# Patient Record
Sex: Female | Born: 1980 | Race: Black or African American | Hispanic: No | Marital: Single | Smoking: Current every day smoker
Health system: Southern US, Community
[De-identification: ages and names within clinical notes are randomized; demographics above are authoritative.]

## PROBLEM LIST (undated history)

## (undated) DIAGNOSIS — M419 Scoliosis, unspecified: Secondary | ICD-10-CM

## (undated) DIAGNOSIS — F32A Depression, unspecified: Secondary | ICD-10-CM

## (undated) DIAGNOSIS — F419 Anxiety disorder, unspecified: Secondary | ICD-10-CM

## (undated) DIAGNOSIS — J45909 Unspecified asthma, uncomplicated: Secondary | ICD-10-CM

## (undated) HISTORY — DX: Depression, unspecified: F32.A

## (undated) HISTORY — PX: DENTAL SURGERY: SHX609

## (undated) HISTORY — DX: Unspecified asthma, uncomplicated: J45.909

## (undated) HISTORY — DX: Anxiety disorder, unspecified: F41.9

---

## 2006-04-25 ENCOUNTER — Emergency Department: Payer: Self-pay | Admitting: Emergency Medicine

## 2009-05-05 ENCOUNTER — Emergency Department: Payer: Self-pay | Admitting: Emergency Medicine

## 2009-12-25 ENCOUNTER — Inpatient Hospital Stay: Payer: Self-pay

## 2010-03-19 ENCOUNTER — Emergency Department: Payer: Self-pay | Admitting: Emergency Medicine

## 2010-03-19 ENCOUNTER — Emergency Department: Payer: Self-pay | Admitting: Internal Medicine

## 2011-12-04 ENCOUNTER — Emergency Department: Payer: Self-pay | Admitting: Emergency Medicine

## 2011-12-04 LAB — PREGNANCY, URINE: Pregnancy Test, Urine: NEGATIVE m[IU]/mL

## 2014-01-14 IMAGING — CR DG CHEST 2V
1 series · 2 of 2 positions shown · non-contrast
Comparison: none

REASON FOR EXAM: chest wall pain
COMMENTS:

[Series 1: w chest pa · 0.14mm/px · 2 of 2 slices shown]
[im 1/2]
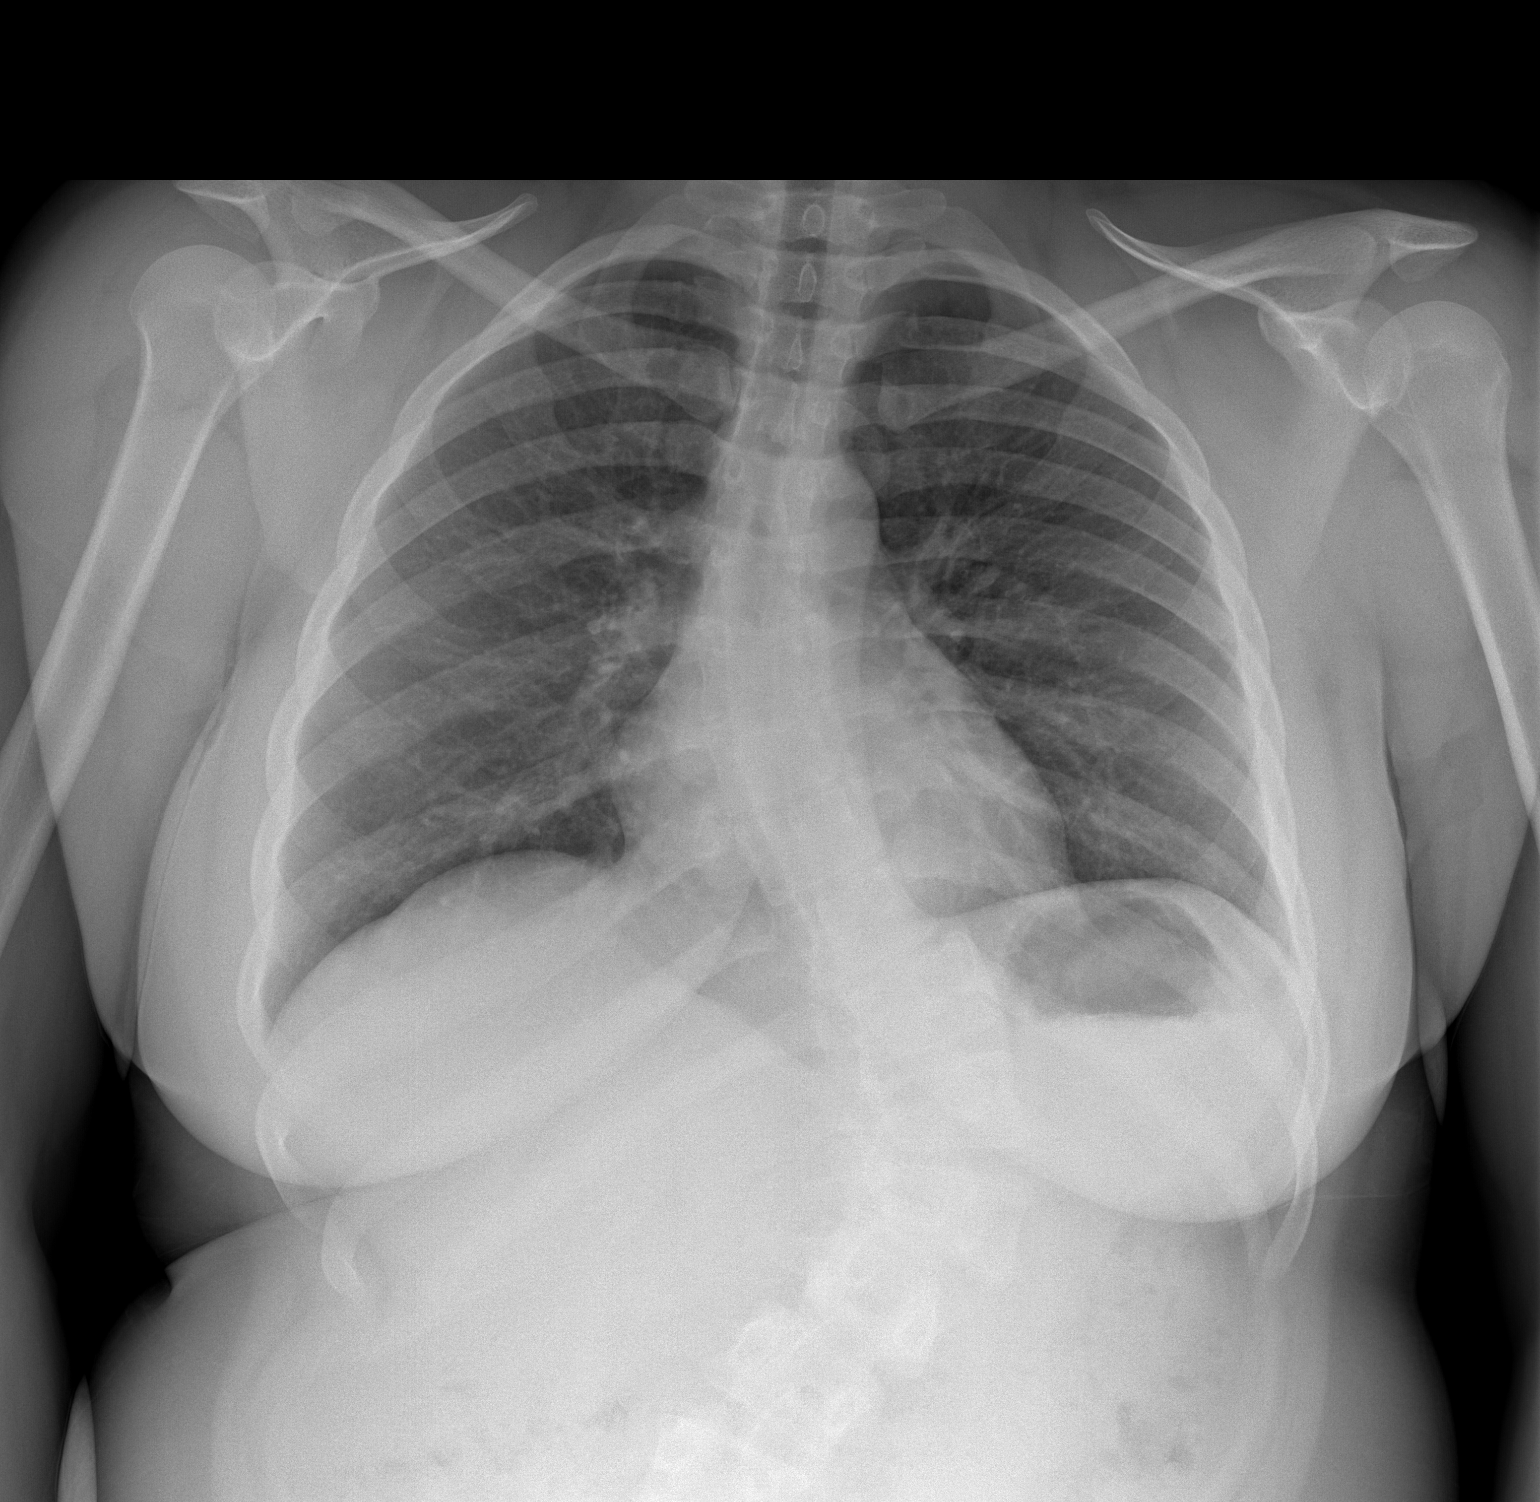
[im 2/2]
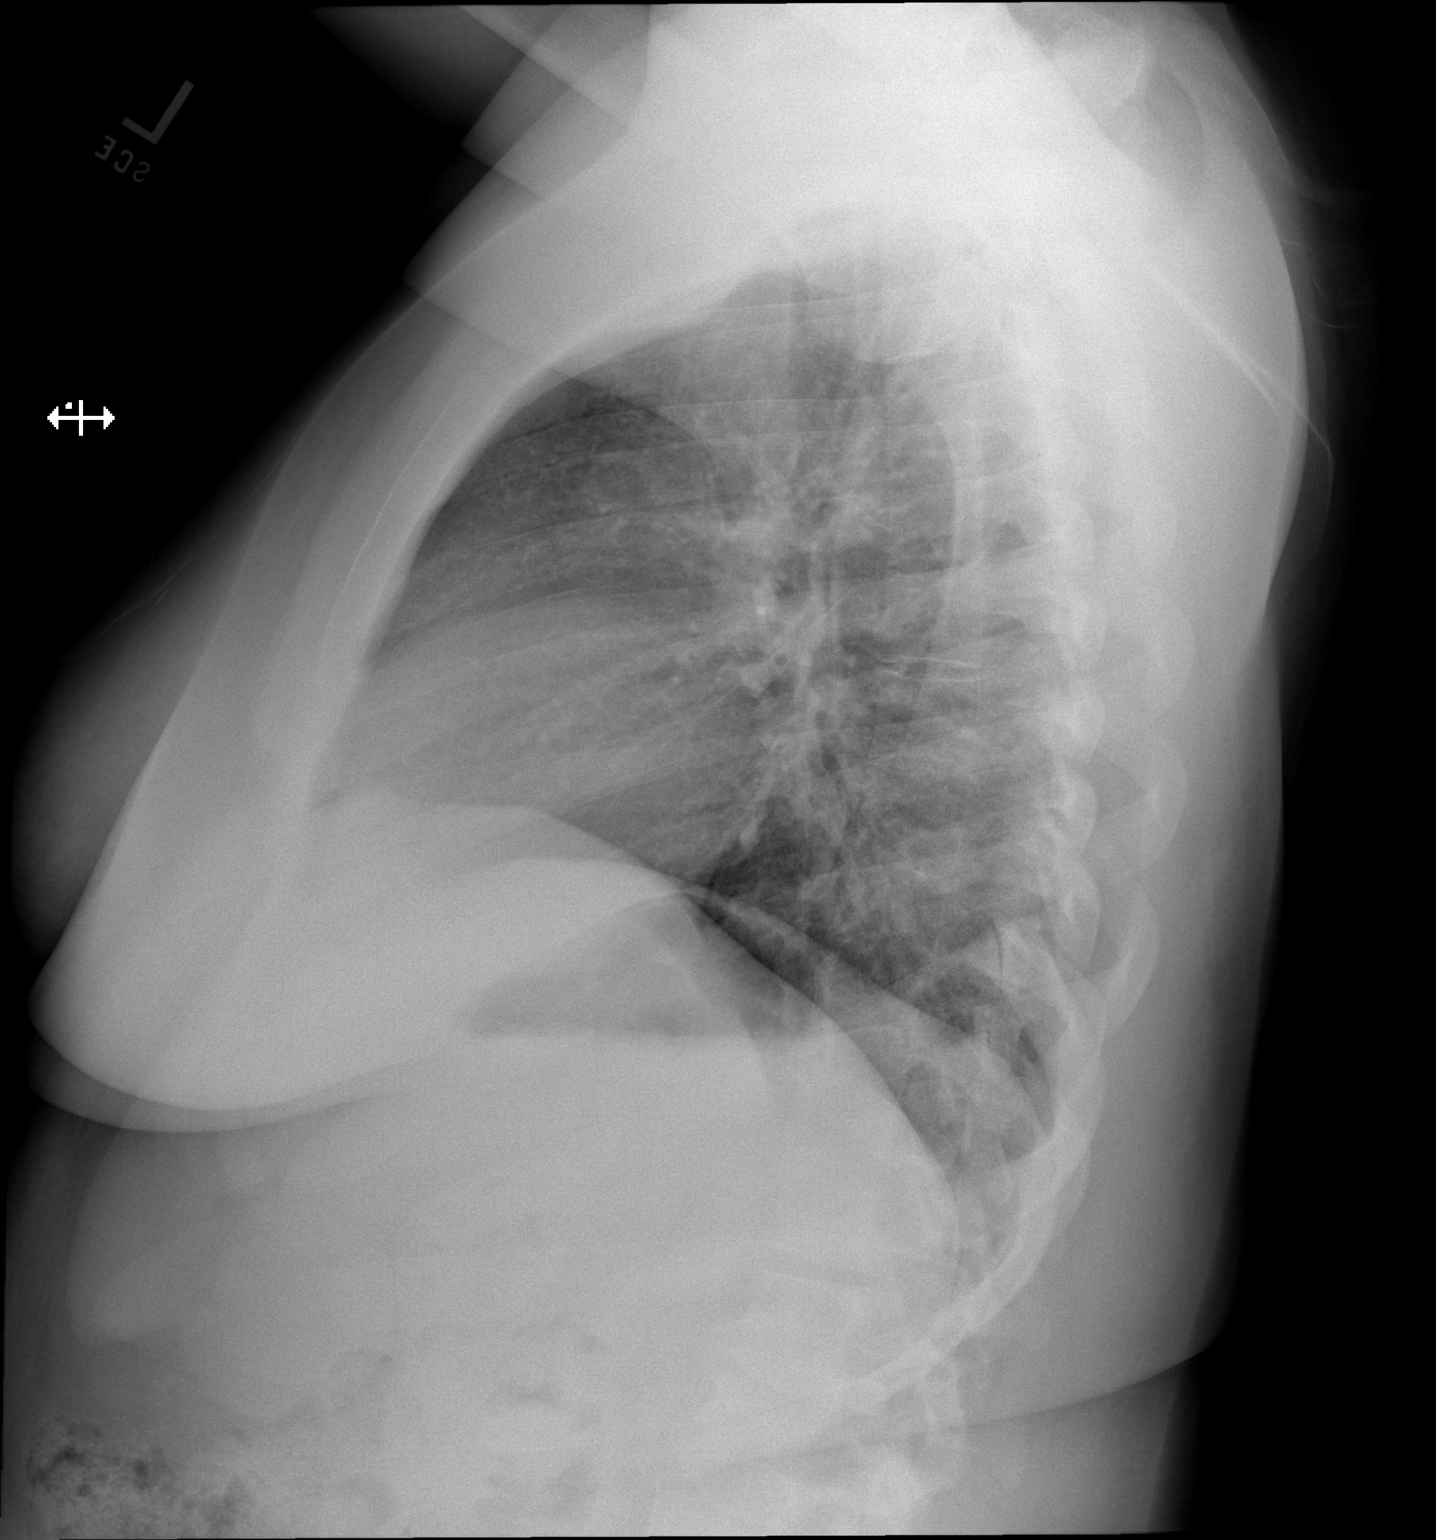

[2 of 2 positions shown; findings below may reference images not displayed]

PROCEDURE:     DXR - DXR CHEST PA (OR AP) AND LATERAL  - December 04, 2011  [DATE]

RESULT:     There is a lower thoracic scoliosis concave toward the left. The
lungs are clear. The cardiac silhouette is normal. The bony structures are
otherwise unremarkable. There is no edema, infiltrate, effusion or
pneumothorax.
IMPRESSION: 1. Thoracic scoliosis.
2. No acute cardiopulmonary disease evident.

[REDACTED]

## 2014-05-13 ENCOUNTER — Emergency Department: Payer: Self-pay | Admitting: Emergency Medicine

## 2016-10-11 ENCOUNTER — Emergency Department
Admission: EM | Admit: 2016-10-11 | Discharge: 2016-10-11 | Disposition: A | Discharge status: Home / Self Care | Payer: No Typology Code available for payment source | Attending: Emergency Medicine | Admitting: Emergency Medicine

## 2016-10-11 DIAGNOSIS — Y999 Unspecified external cause status: Secondary | ICD-10-CM | POA: Insufficient documentation

## 2016-10-11 DIAGNOSIS — S161XXA Strain of muscle, fascia and tendon at neck level, initial encounter: Secondary | ICD-10-CM | POA: Diagnosis not present

## 2016-10-11 DIAGNOSIS — Y92410 Unspecified street and highway as the place of occurrence of the external cause: Secondary | ICD-10-CM | POA: Diagnosis not present

## 2016-10-11 DIAGNOSIS — S3992XA Unspecified injury of lower back, initial encounter: Secondary | ICD-10-CM | POA: Diagnosis present

## 2016-10-11 DIAGNOSIS — S39012A Strain of muscle, fascia and tendon of lower back, initial encounter: Secondary | ICD-10-CM

## 2016-10-11 DIAGNOSIS — J189 Pneumonia, unspecified organism: Secondary | ICD-10-CM

## 2016-10-11 DIAGNOSIS — F17200 Nicotine dependence, unspecified, uncomplicated: Secondary | ICD-10-CM | POA: Insufficient documentation

## 2016-10-11 DIAGNOSIS — Y939 Activity, unspecified: Secondary | ICD-10-CM | POA: Insufficient documentation

## 2016-10-11 HISTORY — DX: Scoliosis, unspecified: M41.9

## 2016-10-11 MED ORDER — AZITHROMYCIN 250 MG PO TABS: ORAL_TABLET | ORAL | 0 refills | Status: DC

## 2016-10-11 MED ORDER — METHOCARBAMOL 500 MG PO TABS: 500.0000 mg | ORAL_TABLET | Freq: Four times a day (QID) | ORAL | 0 refills | Status: DC

## 2016-10-11 MED ORDER — PREDNISONE 50 MG PO TABS: 50.0000 mg | ORAL_TABLET | Freq: Every day | ORAL | 0 refills | Status: DC

## 2016-10-11 MED ORDER — KETOROLAC TROMETHAMINE 30 MG/ML IJ SOLN
30.0000 mg | Freq: Once | INTRAMUSCULAR | Status: AC
  Administered 2016-10-11: 30 mg via INTRAMUSCULAR
  Filled 2016-10-11: qty 1

## 2016-10-11 MED ORDER — ALBUTEROL SULFATE HFA 108 (90 BASE) MCG/ACT IN AERS: 2.0000 | INHALATION_SPRAY | RESPIRATORY_TRACT | 0 refills | Status: DC | PRN

## 2016-10-11 MED ORDER — MELOXICAM 15 MG PO TABS: 15.0000 mg | ORAL_TABLET | Freq: Every day | ORAL | 0 refills | Status: DC
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
## 2016-10-11 NOTE — ED Notes (Signed)
No answer when called several times from lobby 
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
## 2020-04-19 ENCOUNTER — Emergency Department
Admission: EM | Admit: 2020-04-19 | Discharge: 2020-04-19 | Disposition: A | Discharge status: Home / Self Care | Payer: Medicaid Other | Attending: Emergency Medicine | Admitting: Emergency Medicine

## 2020-04-19 ENCOUNTER — Other Ambulatory Visit: Payer: Self-pay

## 2020-04-19 ENCOUNTER — Emergency Department: Payer: Medicaid Other

## 2020-04-19 DIAGNOSIS — S3992XA Unspecified injury of lower back, initial encounter: Secondary | ICD-10-CM | POA: Diagnosis present

## 2020-04-19 DIAGNOSIS — F17200 Nicotine dependence, unspecified, uncomplicated: Secondary | ICD-10-CM | POA: Diagnosis not present

## 2020-04-19 DIAGNOSIS — W19XXXA Unspecified fall, initial encounter: Secondary | ICD-10-CM | POA: Insufficient documentation

## 2020-04-19 DIAGNOSIS — S39012A Strain of muscle, fascia and tendon of lower back, initial encounter: Secondary | ICD-10-CM | POA: Insufficient documentation

## 2020-04-19 DIAGNOSIS — Z79899 Other long term (current) drug therapy: Secondary | ICD-10-CM | POA: Diagnosis not present

## 2020-04-19 LAB — POCT PREGNANCY, URINE: Preg Test, Ur: NEGATIVE

## 2020-04-19 MED ORDER — OXYCODONE-ACETAMINOPHEN 5-325 MG PO TABS
1.0000 | ORAL_TABLET | Freq: Once | ORAL | Status: AC
  Administered 2020-04-19: 17:00:00 1 via ORAL
  Filled 2020-04-19: qty 1

## 2020-04-19 MED ORDER — ORPHENADRINE CITRATE 30 MG/ML IJ SOLN
60.0000 mg | Freq: Once | INTRAMUSCULAR | Status: AC
  Administered 2020-04-19: 19:00:00 60 mg via INTRAMUSCULAR
  Filled 2020-04-19: qty 2

## 2020-04-19 MED ORDER — KETOROLAC TROMETHAMINE 30 MG/ML IJ SOLN
30.0000 mg | Freq: Once | INTRAMUSCULAR | Status: AC
  Administered 2020-04-19: 19:00:00 30 mg via INTRAMUSCULAR
  Filled 2020-04-19: qty 1

## 2020-04-19 MED ORDER — METHOCARBAMOL 500 MG PO TABS: 500.0000 mg | ORAL_TABLET | Freq: Four times a day (QID) | ORAL | 0 refills | Status: DC

## 2020-04-19 MED ORDER — MELOXICAM 15 MG PO TABS: 15.0000 mg | ORAL_TABLET | Freq: Every day | ORAL | 0 refills | Status: DC

## 2020-04-19 NOTE — ED Notes (Addendum)
See triage note. Pt with difficulty walking/bending over after playing with grandchildren. Pt stating pain predominately in right lower back

## 2020-04-19 NOTE — ED Triage Notes (Signed)
Pt states "I was playing with my grandbaby " and since having lower back pain.

## 2020-04-19 NOTE — ED Provider Notes (Signed)
[facility] Emergency Department Provider Note  ____________________________________________  Time seen: Approximately 5:06 PM  I have reviewed the triage vital signs and the nursing notes.   HISTORY  Chief Complaint Back Pain    HPI Kristen Beasley is a 39 y.o. female who presents the emergency department complaining of lower back pain.  Patient states that she was playing with her kids, fell backwards landing on her back.  Patient has a history of scoliosis and has chronic back issues.  Since this injury she has had significantly increased pain.  Some radicular symptoms but no paresthesias.  No bowel or bladder dysfunction, saddle anesthesia.  Over-the-counter medications or not alleviating symptoms.  She did not hit her head or lose consciousness.  Home complaint at this time is lower back pain.  No urinary or GI complaints.         Past Medical History:  Diagnosis Date  . Scoliosis     There are no problems to display for this patient.   History reviewed. No pertinent surgical history.  Prior to Admission medications   Medication Sig Start Date End Date Taking? Authorizing Provider  albuterol (PROVENTIL HFA;VENTOLIN HFA) 108 (90 Base) MCG/ACT inhaler Inhale 2 puffs into the lungs every 4 (four) hours as needed for wheezing or shortness of breath. 10/11/16   Cierra Rothgeb, Charline Bills, PA-C  azithromycin (ZITHROMAX Z-PAK) 250 MG tablet Take 2 tablets (500 mg) on  Beas[place] 1,  followed by 1 tablet (250 mg) once daily on Days 2 through 5. 10/11/16   Barrie Sigmund, Charline Bills, PA-C  meloxicam (MOBIC) 15 MG tablet Take 1 tablet (15 mg total) by mouth daily. 04/19/20   Hadassa Cermak, Charline Bills, PA-C  methocarbamol (ROBAXIN) 500 MG tablet Take 1 tablet (500 mg total) by mouth 4 (four) times daily. 04/19/20   Murna Backer, Charline Bills, PA-C  predniSONE (DELTASONE) 50 MG tablet Take 1 tablet (50 mg total) by mouth daily with breakfast. 10/11/16   Tracye Szuch, Charline Bills, PA-C     Allergies Patient has no known allergies.  No family history on file.  Social History Social History   Tobacco Use  . Smoking status: Current Every [place] Smoker  . Smokeless tobacco: Never Used  Substance Use Topics  . Alcohol use: No  . Drug use: No     Review of Systems  Constitutional: No fever/chills Eyes: No visual changes. No discharge ENT: No upper respiratory complaints. Cardiovascular: no chest pain. Respiratory: no cough. No SOB. Gastrointestinal: No abdominal pain.  No nausea, no vomiting.  No diarrhea.  No constipation. Genitourinary: Negative for dysuria. No hematuria Musculoskeletal: Positive for low back pain after a fall Skin: Negative for rash, abrasions, lacerations, ecchymosis. Neurological: Negative for headaches, focal weakness or numbness. 10-point ROS otherwise negative.  ____________________________________________   PHYSICAL EXAM:  VITAL SIGNS: ED Triage Vitals  Enc Vitals Group     BP 04/19/20 1558 (!) 134/92     Pulse Rate 04/19/20 1558 85     Resp 04/19/20 1558 18     Temp 04/19/20 1558 98.7 F (37.1 C)     Temp Source 04/19/20 1558 Oral     SpO2 04/19/20 1558 99 %     Weight 04/19/20 1555 150 lb (68 kg)     Height 04/19/20 1555 5' (1.524 m)     Head Circumference --      Peak Flow --      Pain Score 04/19/20 1555 10     Pain Loc --  Pain Edu? --      Excl. in [place]? --      Constitutional: Alert and oriented. Well appearing and in no acute distress. Eyes: Conjunctivae are normal. PERRL. EOMI. Head: Atraumatic. ENT:      Ears:       Nose: No congestion/rhinnorhea.      Mouth/Throat: Mucous membranes are moist.  Neck: No stridor.    Cardiovascular: Normal rate, regular rhythm. Normal S1 and S2.  Good peripheral circulation. Respiratory: Normal respiratory effort without tachypnea or retractions. Lungs CTAB. Good air entry to the bases with no decreased or absent breath sounds. Gastrointestinal: Bowel sounds 4  quadrants. Soft and nontender to palpation. No guarding or rigidity. No palpable masses. No distention. No CVA tenderness. Musculoskeletal: Full range of motion to all extremities. No gross deformities appreciated.  Visualization of the lumbar spine reveals no visible signs of trauma with abrasions, lacerations, ecchymosis.  Diffuse tenderness throughout the lumbar spine including bilateral paraspinal muscles.  No point specific tenderness.  No palpable abnormality or step-off.  No extension into the SI joints or sciatic notches.  Dorsalis pedis pulses sensation intact bilateral lower extremities. Neurologic:  Normal speech and language. No gross focal neurologic deficits are appreciated.  Skin:  Skin is warm, dry and intact. No rash noted. Psychiatric: Mood and affect are normal. Speech and behavior are normal. Patient exhibits appropriate insight and judgement.   ____________________________________________   LABS (all labs ordered are listed, but only abnormal results are displayed)  Labs Reviewed  POC URINE PREG, ED  POCT PREGNANCY, URINE   ____________________________________________  EKG   ____________________________________________  RADIOLOGY I personally viewed and evaluated these images as part of my medical decision making, as well as reviewing the written report by the radiologist.  DG Lumbar Spine 2-3 Views  Result Date: 04/19/2020 CLINICAL DATA:  Recent fall with low back pain and history of scoliosis EXAM: LUMBAR SPINE - 3 VIEW COMPARISON:  03/19/2010 FINDINGS: Five lumbar type vertebral bodies are well visualized. Posterior fusion defect at S1 is seen. No vertebral body compression deformities are noted. Scoliosis is seen concave to the right centered at the thoracolumbar junction. No anterolisthesis is seen. IMPRESSION: Changes consistent with scoliosis similar to that seen on prior CT examinations. No acute abnormality noted. Electronically Signed   By: Inez Catalina M.D.    On: 04/19/2020 18:27    ____________________________________________    PROCEDURES  Procedure(s) performed:    Procedures    Medications  ketorolac (TORADOL) 30 MG/ML injection 30 mg (has no administration in time range)  orphenadrine (NORFLEX) injection 60 mg (has no administration in time range)  oxyCODONE-acetaminophen (PERCOCET/ROXICET) 5-325 MG per tablet 1 tablet (1 tablet Oral Given 04/19/20 1727)     ____________________________________________   INITIAL IMPRESSION / ASSESSMENT AND PLAN / ED COURSE  Pertinent labs & imaging results that were available during my care of the patient were reviewed by me and considered in my medical decision making (see chart for details).  Review of the [place] CSRS was performed in accordance of the [place] prior to dispensing any controlled drugs.           Patient's diagnosis is consistent with lumbar strain.  Patient presented to the emergency department complaining of low back pain.  Patient had been playing with her kids, fell backwards landing on her back.  History of scoliosis.  On exam palpable spasms were appreciated.  Imaging revealed no acute changes from previous imaging.  I have a low concern for  ruptured disc or fracture.  Patient will be treated with anti-inflammatory muscle relaxer at home.  Follow-up primary care as needed. Patient is given ED precautions to return to the ED for any worsening or new symptoms.     ____________________________________________  FINAL CLINICAL IMPRESSION(S) / ED DIAGNOSES  Final diagnoses:  Strain of lumbar region, initial encounter      NEW MEDICATIONS STARTED DURING THIS VISIT:  ED Discharge Orders         Ordered    meloxicam (MOBIC) 15 MG tablet  Daily        04/19/20 1835    methocarbamol (ROBAXIN) 500 MG tablet  4 times daily        04/19/20 1835              This chart was dictated using voice recognition software/Dragon. Despite best efforts to proofread, errors  can occur which can change the meaning. Any change was purely unintentional.    Darletta Moll, PA-C 04/19/20 1837    Nance Pear, MD 04/19/20 [phone]

## 2021-09-26 ENCOUNTER — Ambulatory Visit: Payer: Medicaid Other

## 2021-11-23 ENCOUNTER — Ambulatory Visit: Payer: Medicaid Other

## 2022-05-07 ENCOUNTER — Ambulatory Visit (LOCAL_COMMUNITY_HEALTH_CENTER): Payer: Medicaid Other | Admitting: Nurse Practitioner

## 2022-05-07 ENCOUNTER — Encounter: Payer: Self-pay | Admitting: Nurse Practitioner

## 2022-05-07 VITALS — BP 123/77 | HR 79 | Ht 59.0 in | Wt 137.8 lb

## 2022-05-07 DIAGNOSIS — F419 Anxiety disorder, unspecified: Secondary | ICD-10-CM

## 2022-05-07 DIAGNOSIS — Z3009 Encounter for other general counseling and advice on contraception: Secondary | ICD-10-CM | POA: Diagnosis not present

## 2022-05-07 DIAGNOSIS — Z01419 Encounter for gynecological examination (general) (routine) without abnormal findings: Secondary | ICD-10-CM

## 2022-05-07 DIAGNOSIS — A599 Trichomoniasis, unspecified: Secondary | ICD-10-CM

## 2022-05-07 DIAGNOSIS — Z113 Encounter for screening for infections with a predominantly sexual mode of transmission: Secondary | ICD-10-CM

## 2022-05-07 DIAGNOSIS — Z3201 Encounter for pregnancy test, result positive: Secondary | ICD-10-CM | POA: Diagnosis not present

## 2022-05-07 LAB — WET PREP FOR TRICH, YEAST, CLUE
Trichomonas Exam: POSITIVE — AB
Yeast Exam: NEGATIVE

## 2022-05-07 LAB — PREGNANCY, URINE: Preg Test, Ur: POSITIVE — AB

## 2022-05-07 LAB — HEPATITIS B SURFACE ANTIGEN: Hepatitis B Surface Ag: NONREACTIVE

## 2022-05-07 LAB — HM HIV SCREENING LAB: HM HIV Screening: NEGATIVE

## 2022-05-07 LAB — HM HEPATITIS C SCREENING LAB: HM Hepatitis Screen: NEGATIVE

## 2022-05-07 MED ORDER — METRONIDAZOLE 500 MG PO TABS: 500.0000 mg | ORAL_TABLET | Freq: Two times a day (BID) | ORAL | 0 refills | Status: DC

## 2022-05-07 MED ORDER — SM PRENATAL VITAMINS 28-0.8 MG PO TABS: 1.0000 | ORAL_TABLET | Freq: Every day | ORAL | 0 refills | Status: DC
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
## 2022-05-07 NOTE — Progress Notes (Signed)
Pt visit for PE, Pap, and STI testing. Family planning packet provided and contents reviewed. Birth control counseling provided. Smoking cessation counseling provided. RN provided education on mental health regarding pt's reported anxiety. Pt  also reported recent partner change with unprotected sex. Seen by FNP White. Pregnancy test positive, and Trich positive. Initial lab results reviewed with pt and Positive Pregnancy test resources given by FNP White. Pt scheduled Prenatal prior to departing.

## 2022-05-08 LAB — HEMOGLOBIN A1C
Est. average glucose Bld gHb Est-mCnc: 117 mg/dL
Hgb A1c MFr Bld: 5.7 % — ABNORMAL HIGH (ref 4.8–5.6)

## 2022-05-12 LAB — GONOCOCCUS CULTURE

## 2022-05-13 LAB — IGP, APTIMA HPV
HPV Aptima: NEGATIVE
PAP Smear Comment: 0

## 2022-05-17 NOTE — Addendum Note (Signed)
Addended by: Cletis Media on: 05/17/2022 12:19 PM   Modules accepted: Orders

## 2022-05-31 ENCOUNTER — Telehealth: Payer: Self-pay | Admitting: Family Medicine

## 2022-05-31 IMAGING — CR DG LUMBAR SPINE 2-3V
3 series · 3 of 3 positions shown · non-contrast
Comparison: 03/19/2010

CLINICAL DATA: Recent fall with low back pain and history of
scoliosis

EXAM:
LUMBAR SPINE - 3 VIEW

[l-spine ap]
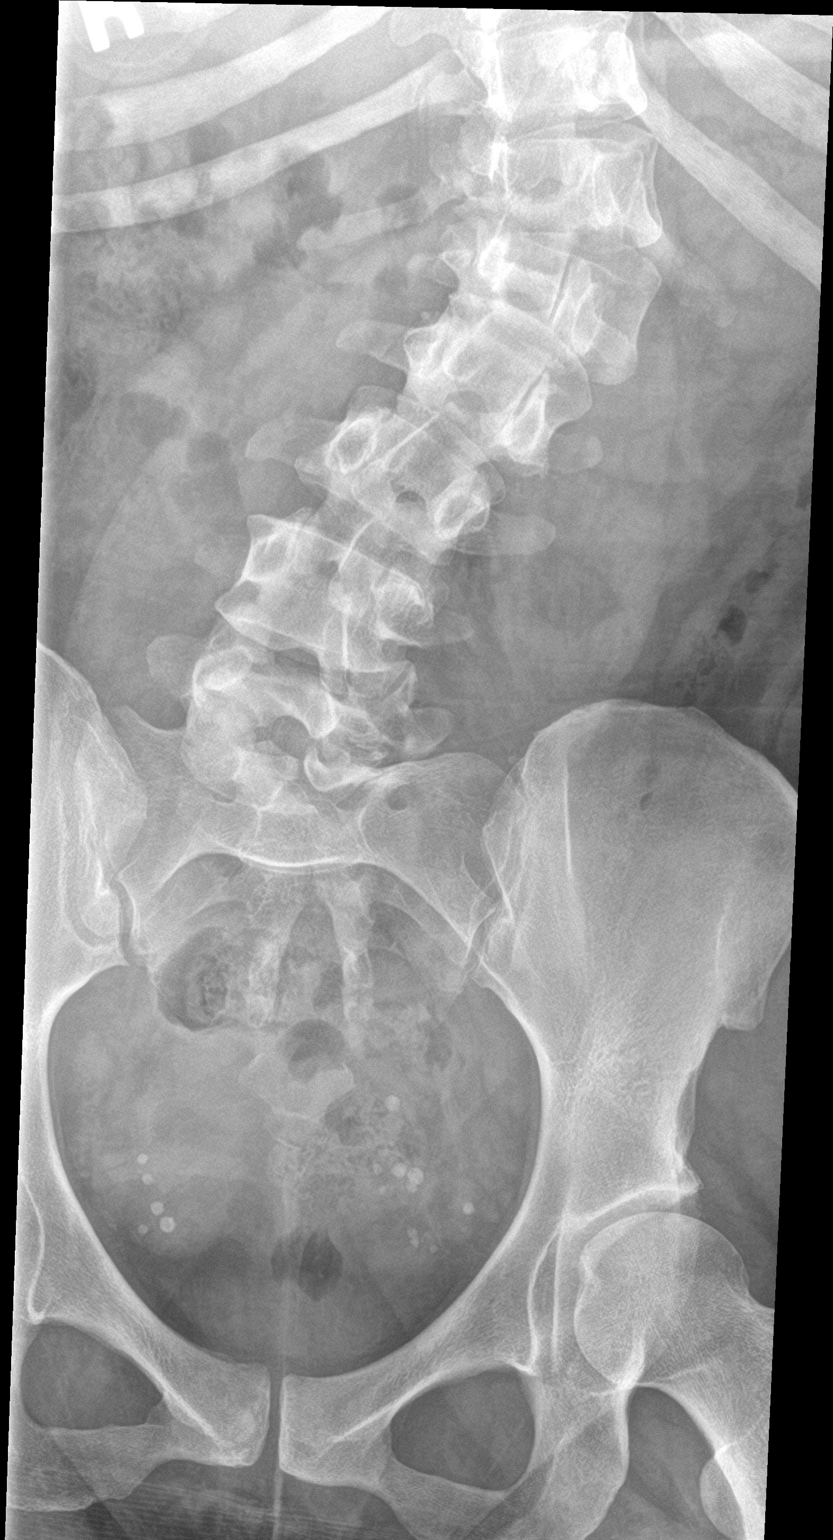

[l-spine spot]
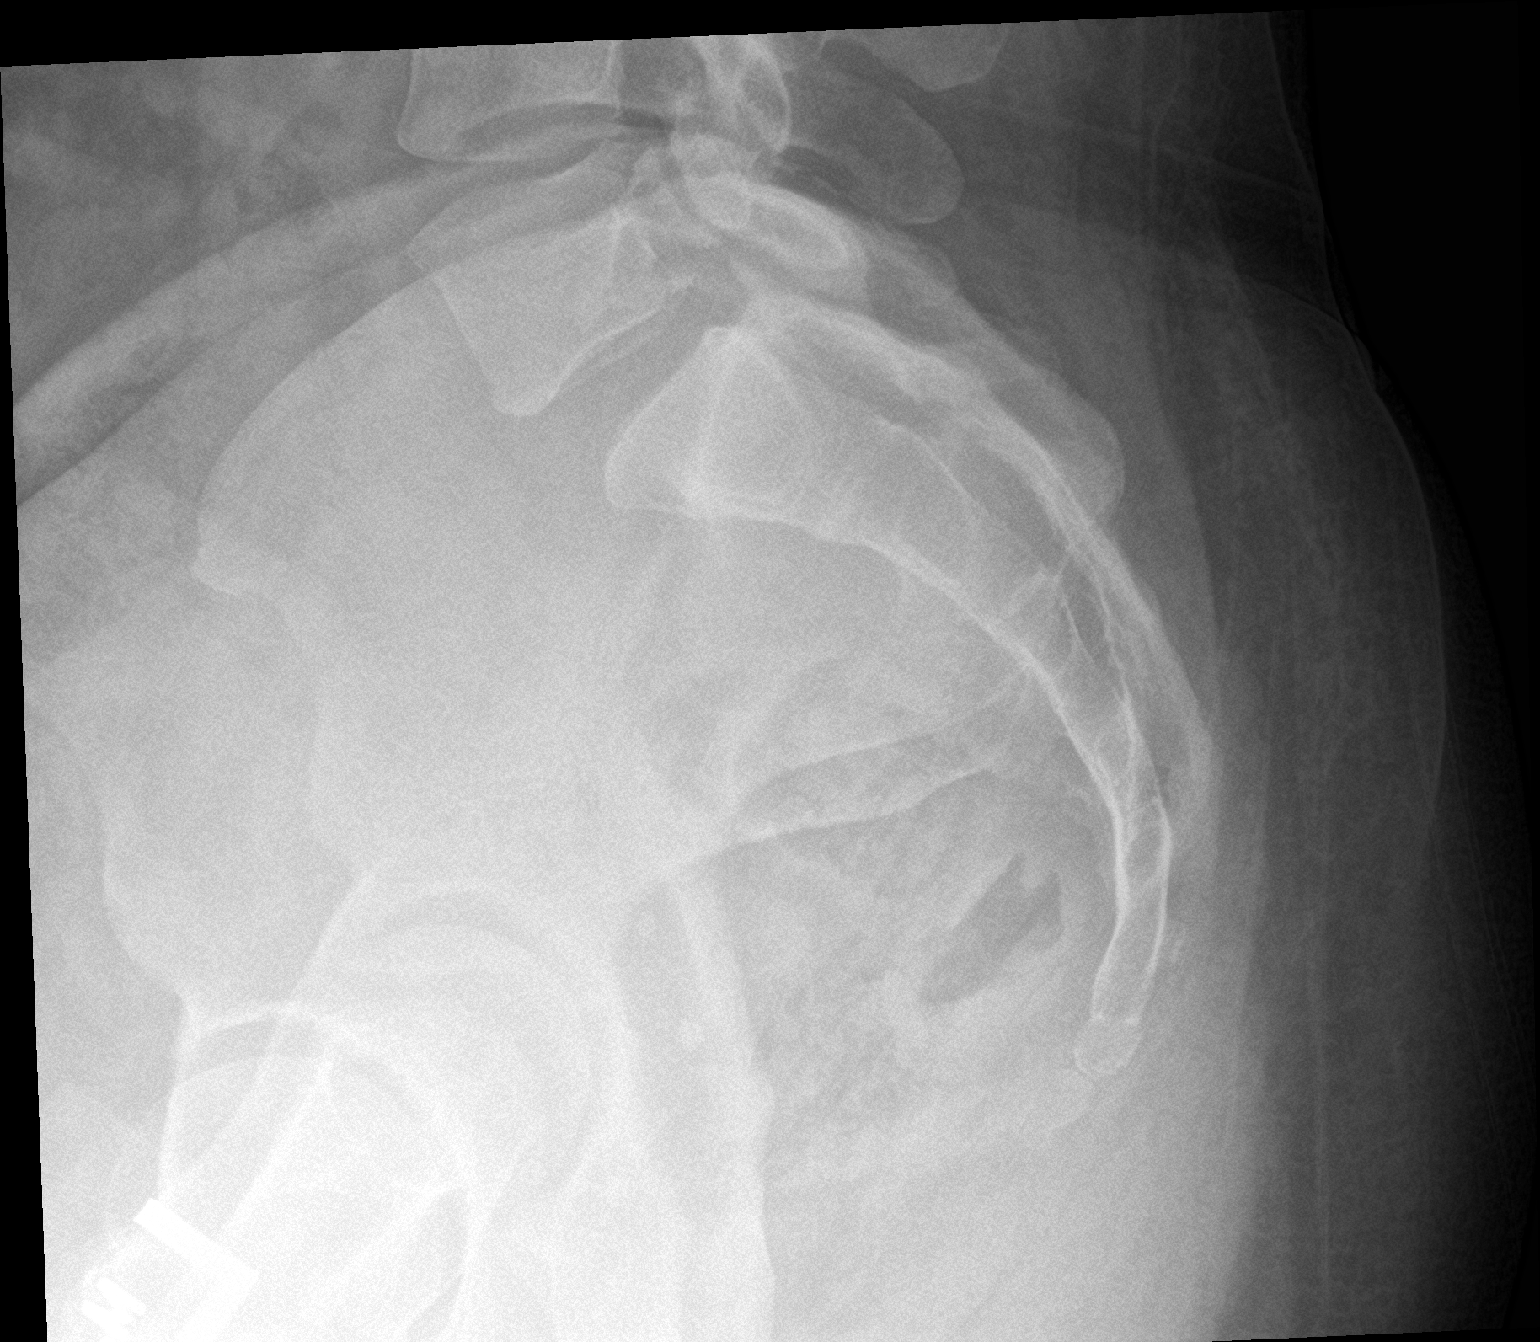

[l-spine lat]
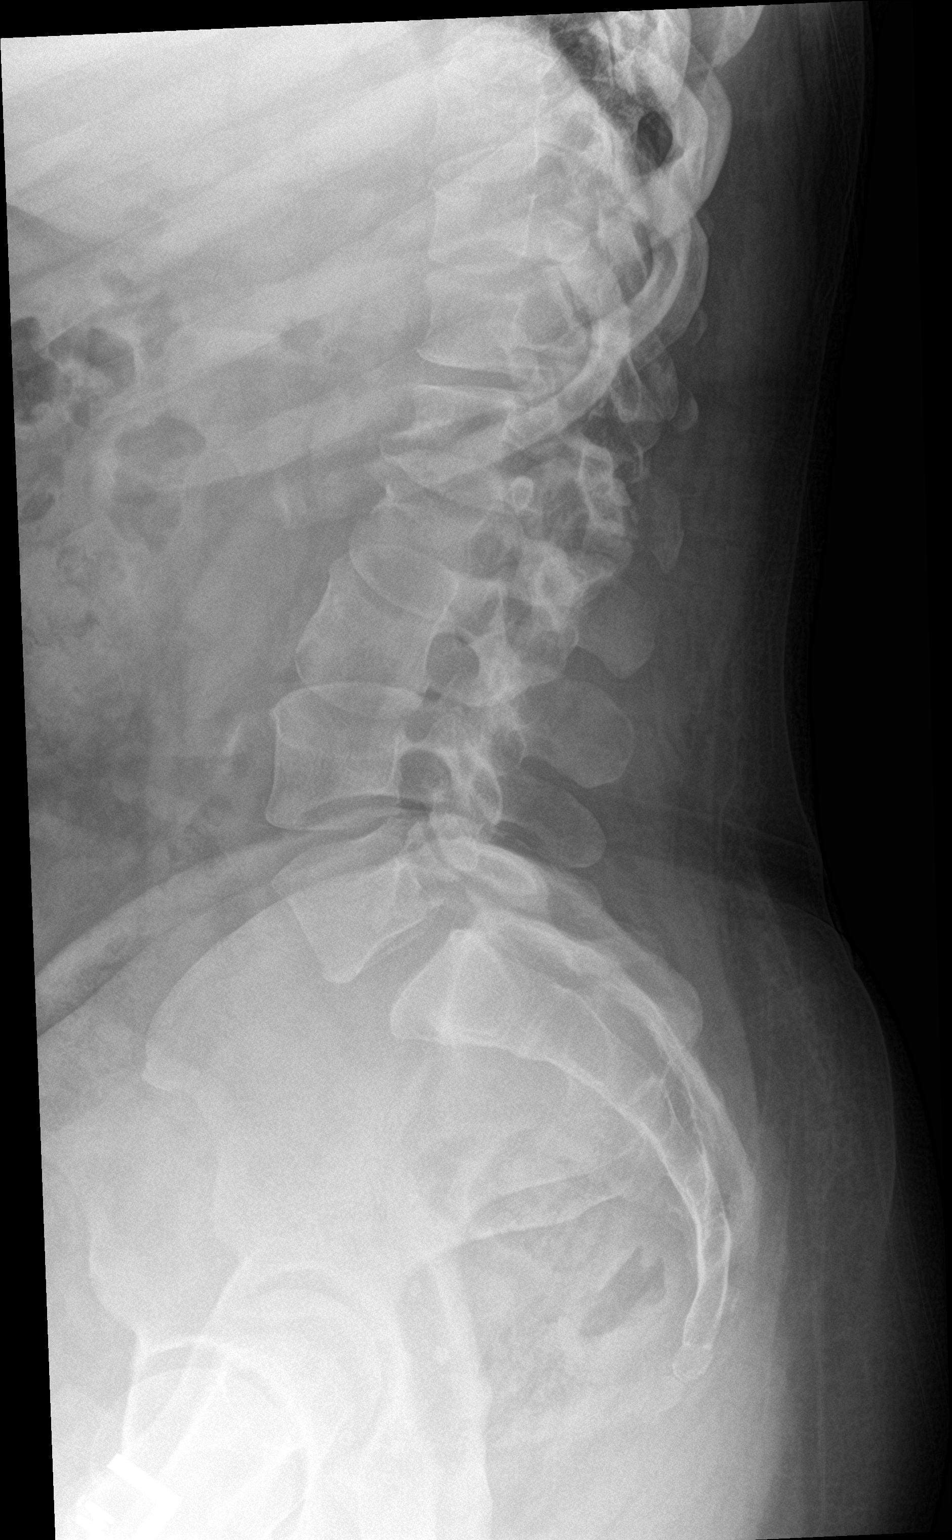

[3 of 3 positions shown; findings below may reference images not displayed]

FINDINGS: Five lumbar type vertebral bodies are well visualized. Posterior
fusion defect at S1 is seen. No vertebral body compression
deformities are noted. Scoliosis is seen concave to the right
centered at the thoracolumbar junction. No anterolisthesis is seen.
IMPRESSION: Changes consistent with scoliosis similar to that seen on prior CT
examinations.

No acute abnormality noted.

## 2022-05-31 NOTE — Telephone Encounter (Signed)
I had a few labs  done on my last visit there I would like the results.

## 2022-06-06 ENCOUNTER — Encounter: Payer: Self-pay | Admitting: Emergency Medicine

## 2022-06-06 DIAGNOSIS — O009 Unspecified ectopic pregnancy without intrauterine pregnancy: Secondary | ICD-10-CM | POA: Diagnosis present

## 2022-06-06 DIAGNOSIS — N939 Abnormal uterine and vaginal bleeding, unspecified: Secondary | ICD-10-CM | POA: Insufficient documentation

## 2022-06-06 DIAGNOSIS — D72829 Elevated white blood cell count, unspecified: Secondary | ICD-10-CM | POA: Insufficient documentation

## 2022-06-06 LAB — COMPREHENSIVE METABOLIC PANEL
ALT: 17 U/L (ref 0–44)
AST: 23 U/L (ref 15–41)
Albumin: 3.7 g/dL (ref 3.5–5.0)
Alkaline Phosphatase: 47 U/L (ref 38–126)
Anion gap: 9 (ref 5–15)
BUN: 11 mg/dL (ref 6–20)
CO2: 23 mmol/L (ref 22–32)
Calcium: 9.5 mg/dL (ref 8.9–10.3)
Chloride: 101 mmol/L (ref 98–111)
Creatinine, Ser: 0.59 mg/dL (ref 0.44–1.00)
GFR, Estimated: >60 mL/min (ref >60)
Glucose, Bld: 139 mg/dL — ABNORMAL HIGH (ref 70–99)
Potassium: 3.5 mmol/L (ref 3.5–5.1)
Sodium: 133 mmol/L — ABNORMAL LOW (ref 135–145)
Total Bilirubin: 0.4 mg/dL (ref 0.3–1.2)
Total Protein: 7.6 g/dL (ref 6.5–8.1)

## 2022-06-06 LAB — LIPASE, BLOOD: Lipase: 32 U/L (ref 11–51)

## 2022-06-06 LAB — CBC
HCT: 35.4 % — ABNORMAL LOW (ref 36.0–46.0)
Hemoglobin: 12.1 g/dL (ref 12.0–15.0)
MCH: 32.7 pg (ref 26.0–34.0)
MCHC: 34.2 g/dL (ref 30.0–36.0)
MCV: 95.7 fL (ref 80.0–100.0)
Platelets: 360 10*3/uL (ref 150–400)
RBC: 3.7 MIL/uL — ABNORMAL LOW (ref 3.87–5.11)
RDW: 13.2 % (ref 11.5–15.5)
WBC: 20 10*3/uL — ABNORMAL HIGH (ref 4.0–10.5)
nRBC: 0 % (ref 0.0–0.2)

## 2022-06-06 LAB — HCG, QUANTITATIVE, PREGNANCY: hCG, Beta Chain, Quant, S: 80364 m[IU]/mL — ABNORMAL HIGH (ref <5)

## 2022-06-06 NOTE — ED Triage Notes (Signed)
Pt reports she is approx [redacted] weeks pregnant and began to have cramping after consuming what she thought was bad food on 11/21 and today she took 2 Midol at 1800. Pt reports vaginal bleeding 1 hour prior to arrival to ED.   LMP- 03/31/2022

## 2022-06-07 ENCOUNTER — Emergency Department
Admission: EM | Admit: 2022-06-07 | Discharge: 2022-06-07 | Disposition: A | Discharge status: Home / Self Care | Payer: Medicaid Other | Attending: Emergency Medicine | Admitting: Emergency Medicine

## 2022-06-07 ENCOUNTER — Emergency Department: Payer: Medicaid Other

## 2022-06-07 DIAGNOSIS — O4690 Antepartum hemorrhage, unspecified, unspecified trimester: Secondary | ICD-10-CM

## 2022-06-07 DIAGNOSIS — N390 Urinary tract infection, site not specified: Secondary | ICD-10-CM

## 2022-06-07 LAB — URINALYSIS, ROUTINE W REFLEX MICROSCOPIC
Bilirubin Urine: NEGATIVE
Glucose, UA: NEGATIVE mg/dL
Ketones, ur: 5 mg/dL — AB
Nitrite: POSITIVE — AB
Protein, ur: 30 mg/dL — AB
Specific Gravity, Urine: 1.027 (ref 1.005–1.030)
Squamous Epithelial / HPF: NONE SEEN (ref 0–5)
pH: 5 (ref 5.0–8.0)

## 2022-06-07 LAB — ABO/RH: ABO/RH(D): O POS

## 2022-06-07 MED ORDER — ACETAMINOPHEN 325 MG PO TABS: ORAL_TABLET | ORAL | 2 refills | Status: AC

## 2022-06-07 MED ORDER — ACETAMINOPHEN 325 MG PO TABS
650.0000 mg | ORAL_TABLET | Freq: Once | ORAL | Status: AC
  Administered 2022-06-07: 650 mg via ORAL
  Filled 2022-06-07: qty 2

## 2022-06-07 MED ORDER — CEPHALEXIN 500 MG PO CAPS: 500.0000 mg | ORAL_CAPSULE | Freq: Three times a day (TID) | ORAL | 0 refills | Status: AC

## 2022-06-07 MED ORDER — SODIUM CHLORIDE 0.9 % IV SOLN
1.0000 g | Freq: Once | INTRAVENOUS | Status: AC
  Administered 2022-06-07: 1 g via INTRAVENOUS
  Filled 2022-06-07: qty 10

## 2022-06-07 NOTE — ED Provider Notes (Signed)
[facility] Provider Note    Event Date/Time   First MD Initiated Contact with Patient 06/07/22 0103     (approximate)   History   Vaginal Bleeding   HPI  BODHI STENGLEIN is a 41 y.o. female who is para 4 gravida 2012.  She had an ectopic pregnancy.  She is having some bleeding from her vagina today.  She thought she had some cramping from bad food on the 21st and took 2 Midol today bleeding started after that.      Physical Exam   Triage Vital Signs: ED Triage Vitals [06/06/22 2254]  Enc Vitals Group     BP (!) 157/119     Pulse Rate 97     Resp 17     Temp 98.2 F (36.8 C)     Temp Source Oral     SpO2 100 %     Weight      Height      Head Circumference      Peak Flow      Pain Score      Pain Loc      Pain Edu?      Excl. in [place]?     Most recent vital signs: Vitals:   06/06/22 2254 06/07/22 0248  BP: (!) 157/119 (!) 106/93  Pulse: 97 89  Resp: 17   Temp: 98.2 F (36.8 C)   SpO2: 100% 100%    General: Awake, no distress.  CV:  Good peripheral perfusion.  Heart regular rate and rhythm no audible murmurs Resp:  Normal effort.  Lungs are clear Abd:  No distention.  Soft there is a little bit of lower abdominal tenderness GYN: There is a blood clot in the vagina there is no active bleeding there is no cervical motion tenderness although there is some mild tenderness throughout the whole vagina during exam.  It is not worse when I touch the cervix. There is no active bleeding the os is closed there is no discharge.  ED Results / Procedures / Treatments   Labs (all labs ordered are listed, but only abnormal results are displayed) Labs Reviewed  CBC - Abnormal; Notable for the following components:      Result Value   WBC 20.0 (*)    RBC 3.70 (*)    HCT 35.4 (*)    All other components within normal limits  HCG, QUANTITATIVE, PREGNANCY - Abnormal; Notable for the following components:   hCG, Beta Chain, Quant, S 80,364 (*)     All other components within normal limits  COMPREHENSIVE METABOLIC PANEL - Abnormal; Notable for the following components:   Sodium 133 (*)    Glucose, Bld 139 (*)    All other components within normal limits  LIPASE, BLOOD  URINALYSIS, ROUTINE W REFLEX MICROSCOPIC  ABO/RH     EKG     RADIOLOGY Ultrasound read by radiology shows 12-week IUP with a small subchorionic hemorrhage.  There is multiple uterine fibroids.   PROCEDURES:  Critical Care performed:   Procedures   MEDICATIONS ORDERED IN ED: Medications  acetaminophen (TYLENOL) tablet 650 mg (650 mg Oral Given 06/07/22 0248)     IMPRESSION / MDM / ASSESSMENT AND PLAN / ED COURSE  I reviewed the triage vital signs and the nursing notes.   Differential diagnosis includes, but is not limited to, subchorionic hemorrhage consistent with threatened miscarriage.  Patient's presentation is most consistent with acute complicated illness / injury requiring diagnostic workup.  Patient's vital signs are stable.  Patient is oh positive hCG is 80,000 she does have a bit of a white count 20 but no fever.  Again no particular cervical motion tenderness even the perineum is as uncomfortable as everything else.   FINAL CLINICAL IMPRESSION(S) / ED DIAGNOSES   Final diagnoses:  Vaginal bleeding in pregnancy     Rx / DC Orders   ED Discharge Orders     None        Note:  This document was prepared using Dragon voice recognition software and may include unintentional dictation errors.   Nena Polio, MD 06/07/22 [phone]

## 2022-06-07 NOTE — Discharge Instructions (Addendum)
There is a small subchorionic hemorrhage.  This is between the placenta and the uterus.  Many of these go away without any problems.  Sometimes you can miscarry.  There is really nothing we can do about it at this point.  Motrin is normally safe in pregnancy up to about 20 weeks but sometimes it may cause increased bleeding.  It probably would be a little bit safer for the baby if you did not take any thing but Tylenol for pain for now.  Please give Dr. Migdalia Dk office a call.  She is on-call for patients without OB/GYN doctors.  Let her know that you had some bleeding in pregnancy.  She should be able to follow you up in about a week or so.  Please return here if you have any worsening bleeding or heavy cramping.  You also have a UTI.  I have given you some Keflex antibiotic 1 pill 3 times a day.  That should help take care of it.  Please return if you have any further problems with that as well.

## 2022-06-07 NOTE — ED Notes (Signed)
Pt taken to US

## 2022-06-09 LAB — URINE CULTURE: Culture: 80000 — AB

## 2022-06-11 ENCOUNTER — Ambulatory Visit: Payer: Medicaid Other | Admitting: Advanced Practice Midwife

## 2022-06-11 ENCOUNTER — Encounter: Payer: Self-pay | Admitting: Advanced Practice Midwife

## 2022-06-11 VITALS — BP 132/74 | HR 97 | Temp 97.8°F | Wt 147.2 lb

## 2022-06-11 DIAGNOSIS — Z98891 History of uterine scar from previous surgery: Secondary | ICD-10-CM

## 2022-06-11 DIAGNOSIS — O2341 Unspecified infection of urinary tract in pregnancy, first trimester: Secondary | ICD-10-CM

## 2022-06-11 DIAGNOSIS — O3410 Maternal care for benign tumor of corpus uteri, unspecified trimester: Secondary | ICD-10-CM

## 2022-06-11 DIAGNOSIS — F32A Depression, unspecified: Secondary | ICD-10-CM

## 2022-06-11 DIAGNOSIS — O09529 Supervision of elderly multigravida, unspecified trimester: Secondary | ICD-10-CM

## 2022-06-11 DIAGNOSIS — O99019 Anemia complicating pregnancy, unspecified trimester: Secondary | ICD-10-CM | POA: Insufficient documentation

## 2022-06-11 DIAGNOSIS — B3731 Acute candidiasis of vulva and vagina: Secondary | ICD-10-CM

## 2022-06-11 DIAGNOSIS — Z5900 Homelessness unspecified: Secondary | ICD-10-CM | POA: Insufficient documentation

## 2022-06-11 DIAGNOSIS — D259 Leiomyoma of uterus, unspecified: Secondary | ICD-10-CM

## 2022-06-11 DIAGNOSIS — F17200 Nicotine dependence, unspecified, uncomplicated: Secondary | ICD-10-CM

## 2022-06-11 DIAGNOSIS — Z972 Presence of dental prosthetic device (complete) (partial): Secondary | ICD-10-CM | POA: Insufficient documentation

## 2022-06-11 DIAGNOSIS — F1410 Cocaine abuse, uncomplicated: Secondary | ICD-10-CM

## 2022-06-11 DIAGNOSIS — O0991 Supervision of high risk pregnancy, unspecified, first trimester: Secondary | ICD-10-CM

## 2022-06-11 DIAGNOSIS — R6252 Short stature (child): Secondary | ICD-10-CM

## 2022-06-11 DIAGNOSIS — O09521 Supervision of elderly multigravida, first trimester: Secondary | ICD-10-CM

## 2022-06-11 DIAGNOSIS — O161 Unspecified maternal hypertension, first trimester: Secondary | ICD-10-CM

## 2022-06-11 DIAGNOSIS — A599 Trichomoniasis, unspecified: Secondary | ICD-10-CM

## 2022-06-11 DIAGNOSIS — O99011 Anemia complicating pregnancy, first trimester: Secondary | ICD-10-CM

## 2022-06-11 DIAGNOSIS — O2340 Unspecified infection of urinary tract in pregnancy, unspecified trimester: Secondary | ICD-10-CM

## 2022-06-11 DIAGNOSIS — O0992 Supervision of high risk pregnancy, unspecified, second trimester: Secondary | ICD-10-CM

## 2022-06-11 DIAGNOSIS — M419 Scoliosis, unspecified: Secondary | ICD-10-CM

## 2022-06-11 DIAGNOSIS — O99340 Other mental disorders complicating pregnancy, unspecified trimester: Secondary | ICD-10-CM

## 2022-06-11 DIAGNOSIS — F1010 Alcohol abuse, uncomplicated: Secondary | ICD-10-CM

## 2022-06-11 DIAGNOSIS — O99341 Other mental disorders complicating pregnancy, first trimester: Secondary | ICD-10-CM

## 2022-06-11 LAB — WET PREP FOR TRICH, YEAST, CLUE: Trichomonas Exam: NEGATIVE

## 2022-06-11 LAB — HEMOGLOBIN, FINGERSTICK: Hemoglobin: 10.8 g/dL — ABNORMAL LOW (ref 11.1–15.9)

## 2022-06-11 MED ORDER — CLOTRIMAZOLE 1 % VA CREA: 1.0000 | TOPICAL_CREAM | Freq: Every day | VAGINAL | 0 refills | Status: AC

## 2022-06-11 MED ORDER — IRON (FERROUS SULFATE) 325 (65 FE) MG PO TABS: 1.0000 | ORAL_TABLET | Freq: Every day | ORAL | 0 refills | Status: AC

## 2022-06-11 NOTE — Progress Notes (Signed)
[place] [facility]  [facility]   INITIAL PRENATAL VISIT NOTE  Subjective:  Kristen Beasley is a 41 y.o. SBF smoker [id number] (26, 18) at 35w0dbeing seen today [facility]. She feels "kinda happy" about surprise pregnancy with no birth control. 41yo unemployed FOB feels "happier than I am" about pregnancy; he has a 91yo son who lives with his grandmother; in 5 1/2 year unsupportive relationship because they are "separated"; he is receiving sFish farm manager She is not working and has been sleeping in her [place]ne [place] past year but is now living with her 224yo daughter and her daughter's 4 kids but she is in subsidized housing and has to move out or her daughter will be evicted.  She has been to ER x1 on 06/07/22 and diagnosed with UTI and received Keflex TID x 10 days and also had an u/s at 12 3/7 with small subchorionic hemorrhage.  Last cig this am 4-10 cpd. Last vaped 04/2022. Last cigar 02/2022. Last MJ last night. Last cocaine 12/2021 snort. Last ETOH 04/27/22 (6 beers) qo weekend. Last pap 05/07/22 neg HPV neg. LMP 03/27/22.   She is currently monitored for the following issues for this high-risk pregnancy and has Supervision of high risk pregnancy in second trimester; Smoker 4-10 cpd; hx cocaine; Scoliosis; hx alcohol abuse; Advanced maternal age in multigravida 41yo; [facility]urinary tract infection) [facility]; Uterine fibroids affecting pregnancy seen on 06/07/22 u/s; Low birth weight infants at term x 2 (4#5 and 5#5); Previous cesarean section 03/17/2004; Elevated blood pressure affecting pregnancy in first trimester, 157/119 on 06/06/22 in ER; Trichomonas infection 05/07/22; Depression/anxiety affecting pregnancy; and Short stature 4'11" on their problem list.  Patient reports no complaints.   .  .   . Denies leaking of fluid.   Indications for ASA therapy (per uptodate) One of the following: Previous  pregnancy with preeclampsia, especially early onset and with an adverse outcome No Multifetal gestation No Chronic hypertension No Type 1 or 2 diabetes mellitus No Chronic kidney disease No Autoimmune disease (antiphospholipid syndrome, systemic lupus erythematosus) No  Two or more of the following: Nulliparity No Obesity (body mass index >30 kg/m2) No Family history of preeclampsia in mother or sister No Age ?329years Yes Sociodemographic characteristics (African American race, low socioeconomic level) Yes Personal risk factors (eg, previous pregnancy with low birth weight or small for gestational age infant, previous adverse pregnancy outcome [eg, stillbirth], interval >10 years between pregnancies) Yes   The following portions of the patient's history were reviewed and updated as appropriate: allergies, current medications, past family history, past medical history, past social history, past surgical history and problem list. Problem list updated.  Objective:   Vitals:   06/11/22 1409  BP: 132/74  Pulse: 97  Temp: 97.8 F (36.6 C)  Weight: 147 lb 3.2 oz (66.8 kg)    Fetal Status:            Physical Exam Vitals and nursing note reviewed.  Constitutional:      General: She is not in acute distress.    Appearance: Normal appearance. She is well-developed and normal weight.  HENT:     Head: Normocephalic and atraumatic.     Right Ear: External ear normal.     Left Ear: External ear normal.     Nose: Nose normal. No congestion or rhinorrhea.     Mouth/Throat:  Lips: Pink.     Mouth: Mucous membranes are moist.     Dentition: Normal dentition. No dental caries.     Pharynx: Oropharynx is clear. Uvula midline.     Comments: Dentition: poor  Last dental exam late 30's Eyes:     General: No scleral icterus.    Conjunctiva/sclera: Conjunctivae normal.  Neck:     Thyroid: No thyroid mass, thyromegaly or thyroid tenderness.  Cardiovascular:     Rate and Rhythm:  Normal rate.     Pulses: Normal pulses.     Comments: Extremities are warm and well perfused Pulmonary:     Effort: Pulmonary effort is normal.     Breath sounds: Normal breath sounds.  Chest:     Chest wall: No mass.  Breasts:    Tanner Score is 5.     Breasts are symmetrical.     Right: Normal. No mass, nipple discharge or skin change.     Left: Normal. No mass, nipple discharge or skin change.  Abdominal:     Palpations: Abdomen is soft.     Tenderness: There is no abdominal tenderness.     Comments: Gravid, soft without tenderness, FH=20, no FHR heard  Genitourinary:    General: Normal vulva.     Exam position: Lithotomy position.     Pubic Area: No rash.      Labia:        Right: No rash.        Left: No rash.      Vagina: Vaginal discharge (white creamy leukorrhea, ph<4.5) present.     Cervix: Normal.     Uterus: Normal. Enlarged (Gravid 20 wks size, no FHR heard). Not tender.      Rectum: Normal. No external hemorrhoid.  Musculoskeletal:     Right lower leg: No edema.     Left lower leg: No edema.  Lymphadenopathy:     Cervical: No cervical adenopathy.     Upper Body:     Right upper body: No axillary adenopathy.     Left upper body: No axillary adenopathy.  Skin:    General: Skin is warm.     Capillary Refill: Capillary refill takes less than 2 seconds.  Neurological:     Mental Status: She is alert.     Assessment and Plan:  Pregnancy: [id number] at [redacted]w[redacted]d 1. Supervision of high risk pregnancy in second trimester Desires NIPS Accepts ASA 81 mg daily to begin at 12 wks U/s ordered for viability/dating/ measure fibroids Please give pt dental list and encourage exam Please call R. TKarrie Doffing LCSW and coordinate care for housing  - MaterniT 21 plus Core, Blood - Pregnancy, Initial Screen - Hgb Fractionation Cascade - Varicella zoster antibody, IgG - Lead, blood (adult age 266yrs or greater) - QuantiFERON-TB Gold Plus -- [id number] Screen - WET PREP FOR  TRICH, YEAST, CLUE - Hemoglobin, venipuncture - Ambulatory referral to [facility] 2. Smoker Smoking 4-10 cpd; counseled via 5 A's to stop  3. hx cocaine Last use 12/2021 Agrees to UDS today  4. Scoliosis, unspecified scoliosis type, unspecified spinal region   5. hx alcohol abuse Pt states last use 04/27/22 (6 beers) qo weekend Counseled not to drink in pregnancy  6. Antepartum multigravida of advanced maternal age 89yo Desires genetic counseling   7. Urinary tract infection in mother during pregnancy, antepartum dx'd ER 06/07/22 TOC today On Keflex TID x 10 days  8. Leiomyoma of uterus affecting pregnancy, antepartum Monitor "  Multiple uterine fibroids" on 06/07/22 [facility] at [facility]  9. Low birth weight infants at term x 2 (4#5 and 5#5) 4#5 and 5#5 at term  4. Previous cesarean section 03/17/2004 For fetal distress Pt desires TOLAC  11. Elevated blood pressure affecting pregnancy in first trimester, 157/119 on 06/06/22 in ER Monitor 132/74 today  12. Trichomonas infection 05/07/22 This is TOC  13. Depression/anxiety affecting pregnancy Accepts counseling with Milton Ferguson, LCSW Behavioral consent form signed Please give pt contact info for [facility]  14. Short stature 4'11"     Discussed overview of care and coordination with inpatient delivery practices including [facility], [facility], [facility] and [facility].   Reviewed Centering pregnancy as standard of care at [facility]   Preterm labor symptoms and general obstetric precautions including but not limited to vaginal bleeding, contractions, leaking of fluid and fetal movement were reviewed in detail with the patient.  Please refer to After Visit Summary for other counseling recommendations.   Return in about 4 weeks (around 07/09/2022) for routine PNC.  No future appointments.  Herbie Saxon, CNM

## 2022-06-11 NOTE — Progress Notes (Signed)
Patient here for new OB visit at 13 weeks. Patient lives with daughter and son in daughter's home. States she was living in her [place] before she found out she was pregnant. States needs help with housing. Last Pap was 05/07/2022, NIL and HPV negative. Last PE was 05/07/2022. Patient had ED visit for bleeding on 06/07/2022, see U/S for dating from that visit. Declines flu vaccine today.Jenetta Downer, RN

## 2022-06-11 NOTE — Progress Notes (Signed)
Wet mount reviewed, patient treated for yeast per SO. Hgb=10.8, iron initiated and anemia profile ordered. Patient given resource list and A. [facility]borne phone number to call about housing options. Patient signed [facility] consent and given AMarchia Bond card with contact information. Patient decided to switch delivering providers to [facility] after visit. Will need pamphlet about delivering at [facility] contact card at next visit. [facility] U/S referral faxed and patient counseled to expect a call from [facility] to schedule U/S.Marland KitchenJenetta Downer, RN

## 2022-06-13 ENCOUNTER — Telehealth: Payer: Self-pay | Admitting: Family Medicine

## 2022-06-13 LAB — PREGNANCY, INITIAL SCREEN
Antibody Screen: NEGATIVE
Basophils Absolute: 0.1 10*3/uL (ref 0.0–0.2)
Basos: 0 %
Bilirubin, UA: NEGATIVE
Chlamydia trachomatis, NAA: NEGATIVE
EOS (ABSOLUTE): 0.2 10*3/uL (ref 0.0–0.4)
Eos: 1 %
Glucose, UA: NEGATIVE
HCV Ab: NONREACTIVE
HIV Screen 4th Generation wRfx: NONREACTIVE
Hematocrit: 32.5 % — ABNORMAL LOW (ref 34.0–46.6)
Hemoglobin: 10.9 g/dL — ABNORMAL LOW (ref 11.1–15.9)
Hepatitis B Surface Ag: NEGATIVE
Immature Grans (Abs): 0.2 10*3/uL — ABNORMAL HIGH (ref 0.0–0.1)
Immature Granulocytes: 1 %
Ketones, UA: NEGATIVE
Leukocytes,UA: NEGATIVE
Lymphocytes Absolute: 5 10*3/uL — ABNORMAL HIGH (ref 0.7–3.1)
Lymphs: 27 %
MCH: 32.5 pg (ref 26.6–33.0)
MCHC: 33.5 g/dL (ref 31.5–35.7)
MCV: 97 fL (ref 79–97)
Monocytes Absolute: 1.5 10*3/uL — ABNORMAL HIGH (ref 0.1–0.9)
Monocytes: 8 %
Neisseria Gonorrhoeae by PCR: NEGATIVE
Neutrophils Absolute: 11.7 10*3/uL — ABNORMAL HIGH (ref 1.4–7.0)
Neutrophils: 63 %
Nitrite, UA: NEGATIVE
Platelets: 383 10*3/uL (ref 150–450)
Protein,UA: NEGATIVE
RBC, UA: NEGATIVE
RBC: 3.35 x10E6/uL — ABNORMAL LOW (ref 3.77–5.28)
RDW: 12.1 % (ref 11.7–15.4)
RPR Ser Ql: NONREACTIVE
Rh Factor: POSITIVE
Rubella Antibodies, IGG: 19.1 index (ref >0.99)
Specific Gravity, UA: 1.024 (ref 1.005–1.030)
Urobilinogen, Ur: 0.2 mg/dL (ref 0.2–1.0)
WBC: 18.7 10*3/uL — ABNORMAL HIGH (ref 3.4–10.8)
pH, UA: 7 (ref 5.0–7.5)

## 2022-06-13 LAB — MICROSCOPIC EXAMINATION
Bacteria, UA: NONE SEEN
Casts: NONE SEEN /lpf
RBC, Urine: NONE SEEN /hpf (ref 0–2)
WBC, UA: NONE SEEN /hpf (ref 0–5)

## 2022-06-13 LAB — FE+CBC/D/PLT+TIBC+FER+RETIC
Ferritin: 72 ng/mL (ref 15–150)
Iron Saturation: 20 % (ref 15–55)
Iron: 68 ug/dL (ref 27–159)
Retic Ct Pct: 2 % (ref 0.6–2.6)
Total Iron Binding Capacity: 348 ug/dL (ref 250–450)
UIBC: 280 ug/dL (ref 131–425)

## 2022-06-13 LAB — HCV INTERPRETATION

## 2022-06-13 LAB — URINE CULTURE, OB REFLEX: Organism ID, Bacteria: NO GROWTH

## 2022-06-13 NOTE — Telephone Encounter (Signed)
Pt calls & requests results for determining the gender of her baby.

## 2022-06-14 NOTE — Telephone Encounter (Signed)
Return call to client and left message to return call with number to call provided. As of 1030 06/14/2022, MaterniT21 results still pending. Rich Number, RN

## 2022-06-15 ENCOUNTER — Encounter: Payer: Self-pay | Admitting: Advanced Practice Midwife

## 2022-06-15 DIAGNOSIS — R825 Elevated urine levels of drugs, medicaments and biological substances: Secondary | ICD-10-CM | POA: Insufficient documentation

## 2022-06-15 DIAGNOSIS — D582 Other hemoglobinopathies: Secondary | ICD-10-CM | POA: Insufficient documentation

## 2022-06-15 LAB — 789231 7+OXYCODONE-BUND
Amphetamines, Urine: NEGATIVE ng/mL
BENZODIAZ UR QL: NEGATIVE ng/mL
Barbiturate screen, urine: NEGATIVE ng/mL
Cocaine (Metab.): NEGATIVE ng/mL
OPIATE SCREEN URINE: NEGATIVE ng/mL
Oxycodone/Oxymorphone, Urine: NEGATIVE ng/mL
PCP Quant, Ur: NEGATIVE ng/mL

## 2022-06-15 LAB — HGB FRACTIONATION CASCADE: Hgb A2: 1.6 % — ABNORMAL LOW (ref 1.8–3.2)

## 2022-06-15 LAB — MATERNIT 21 PLUS CORE, BLOOD
Fetal Fraction: 9
Result (T21): NEGATIVE
Trisomy 13 (Patau syndrome): NEGATIVE
Trisomy 18 (Edwards syndrome): NEGATIVE
Trisomy 21 (Down syndrome): NEGATIVE

## 2022-06-15 LAB — QUANTIFERON-TB GOLD PLUS
QuantiFERON Mitogen Value: >10 IU/mL
QuantiFERON Nil Value: 0 IU/mL
QuantiFERON TB1 Ag Value: 0 IU/mL
QuantiFERON TB2 Ag Value: 0 IU/mL
QuantiFERON-TB Gold Plus: NEGATIVE

## 2022-06-15 LAB — HGB FRACTIONATION BY HPLC
Hgb A: 96.9 % (ref 96.4–98.8)
Hgb C: 0 %
Hgb E: 0 %
Hgb F: 0 % (ref 0.0–2.0)
Hgb S: 0 %
Hgb Variant: 1.5 % — ABNORMAL HIGH

## 2022-06-15 LAB — CANNABINOID CONFIRMATION, UR
CANNABINOIDS: POSITIVE — AB
Carboxy THC GC/MS Conf: 467 ng/mL

## 2022-06-15 LAB — LEAD, BLOOD (ADULT >= 16 YRS): Lead-Whole Blood: <1 ug/dL (ref 0.0–3.4)

## 2022-06-15 LAB — VARICELLA ZOSTER ANTIBODY, IGG: Varicella zoster IgG: 356 index (ref >165)

## 2022-06-18 NOTE — Telephone Encounter (Signed)
Call to client to notify her of requested result. Per client, found out result when she had her [place] earlier today. Rich Number, RN

## 2022-06-27 ENCOUNTER — Ambulatory Visit: Payer: Medicaid Other | Admitting: Licensed Clinical Social Worker

## 2022-06-27 NOTE — Progress Notes (Unsigned)
Counselor Initial Adult Exam  Name: Kristen Beasley Date: 06/27/2022 MRN: [id number] DOB: September 17, 1980 PCP: Patient, No Pcp Per  Time spent: ***  A biopsychosocial was completed on the Patient. Background information and current concerns were obtained during an intake in the office or on Zoom or on Phone with the [facility] Department clinician, Milton Ferguson, LCSW.  Reviewed profession disclosure, contact information and confidentiality was discussed and appropriate consents were signed.    Reason for Visit /Presenting Problem: ***  Mental Status Exam:    Appearance:   {PSY:22683}     Behavior:  {PSY:21022743}  Motor:  {PSY:22302}  Speech/Language:   {PSY:22685}  Affect:  {PSY:22687}  Mood:  {PSY:31886}  Thought process:  {PSY:31888}  Thought content:    {PSY:[phone]}  Sensory/Perceptual disturbances:    {PSY:812-340-9104}  Orientation:  {PSY:30297}  Attention:  {PSY:22877}  Concentration:  {[phone]}  Memory:  {PSY:9197133709}  Fund of knowledge:   {[phone]}  Insight:    {[phone]}  Judgment:   {[phone]}  Impulse Control:  {[phone]}   Reported Symptoms:  {PSY:843 356 4929}  Risk Assessment: Danger to Self:  {PSY:22692} Self-injurious Behavior: {PSY:22692} Danger to Others: {PSY:22692} Duty to Warn:{PSY:311194} Physical Aggression / Violence:{PSY:21197} Access to Firearms a concern: {PSY:21197} Gang Involvement:{PSY:21197} Patient / guardian was educated about steps to take if suicide or homicide risk level increases between visits: yes While future psychiatric events cannot be accurately predicted, the patient does not currently require acute inpatient psychiatric care and does not currently meet [facility] involuntary commitment criteria.  Substance Abuse History: Current substance abuse: {PSY:21197}    Past Psychiatric History:   {Past psych history:20559} Outpatient Providers:*** History of Psych Hospitalization:  {PSY:21197} Psychological Testing: {PSY:21014032}   Abuse History: Victim of {Abuse History:314532}, {Type of abuse:20566}   Report needed: {PSY:314532} Victim of Neglect:{yes no:314532} Perpetrator of {PSY:20566}  Witness / Exposure to Domestic Violence: {PSY:21197}  Protective Services Involvement: {PSY:21197} Witness to [employer] Violence:  {PSY:21197}  Family History:  Family History  Problem Relation Age of Onset   Diabetes Mother    Cancer Father    Lung cancer Father    Bone cancer Father    Diabetes Father    Diabetes Sister    Diabetes Brother     Social History:  Social History   Socioeconomic History   Marital status: Single    Spouse name: Not on file   Number of children: 2   Years of education: 10   Highest education level: 10th grade  Occupational History   Occupation: Homemaker  Tobacco Use   Smoking status: Every Day    Packs/day: 0.25    Types: Cigarettes    Start date: 1998    Passive exposure: Never   Smokeless tobacco: Never  Vaping Use   Vaping Use: Never used  Substance and Sexual Activity   Alcohol use: Yes    Alcohol/week: 3.0 standard drinks of alcohol    Types: 2 Glasses of wine, 1 Cans of beer per week    Comment: before found out pregnancy   Drug use: Yes    Types: Marijuana    Comment: Uses MJ for anxiety and sleep   Sexual activity: Yes    Partners: Male    Birth control/protection: None, Condom, Injection  Other Topics Concern   Not on file  Social History Narrative   Lives with son.    Social Determinants of Health   Financial Resource Strain: High Risk (06/11/2022)   Overall Financial Resource Strain (CARDIA)  Difficulty of Paying Living Expenses: Very hard  Food Insecurity: Food Insecurity Present (06/11/2022)   Hunger Vital Sign    Worried About Running Out of Food in the Last Year: Sometimes true    Ran Out of Food in the Last Year: Sometimes true  Transportation Needs: No Transportation Needs (06/11/2022)    PRAPARE - Hydrologist (Medical): No    Lack of Transportation (Non-Medical): No  Physical Activity: Not on file  Stress: Not on file  Social Connections: Not on file    Living situation: the patient {lives:315711::"lives with their family"}  Sexual Orientation:  {Sexual Orientation:[phone]}  Relationship Status: {Desc; marital status:62}  Name of spouse / other:***             If a parent, number of children / ages:***  Support Systems; {DIABETES SUPPORT:20310}  Financial Stress:  {YES/NO:21197}  Income/Employment/Disability: Geneticist, molecular: [employer]  Educational History: Education: {PSY :31912}  Religion/Sprituality/World View:   {CHL AMB RELIGION/SPIRITUALITY:[phone]}  Any cultural differences that may affect / interfere with treatment:  not applicable   Recreation/Hobbies: {Woc hobbies:30428}  Stressors:{PATIENT STRESSORS:22669}  Strengths:  {Patient Coping Strengths:[phone]}  Barriers:  ***   Legal History: Pending legal issue / charges: {PSY:20588} History of legal issue / charges: {Legal Issues:[phone]}  Medical History/Surgical History:reviewed Past Medical History:  Diagnosis Date   Anxiety    Asthma    Depression    Scoliosis     Past Surgical History:  Procedure Laterality Date   CESAREAN SECTION     DENTAL SURGERY Right     Medications: Current Outpatient Medications  Medication Sig Dispense Refill   acetaminophen (TYLENOL) 325 MG tablet Take Tylenol 1 to 2 pills no more than 4 times a day as needed for pain 100 tablet 2   albuterol (PROVENTIL HFA;VENTOLIN HFA) 108 (90 Base) MCG/ACT inhaler Inhale 2 puffs into the lungs every 4 (four) hours as needed for wheezing or shortness of breath. (Patient not taking: Reported on 06/11/2022) 1 Inhaler 0   azithromycin (ZITHROMAX Z-PAK) 250 MG tablet Take 2 tablets (500 mg) on  Day 1,  followed by 1 tablet (250 mg) once daily on  Days 2 through 5. (Patient not taking: Reported on 06/11/2022) 6 each 0   Iron, Ferrous Sulfate, 325 (65 Fe) MG TABS Take 1 tablet by mouth daily. 100 tablet 0   meloxicam (MOBIC) 15 MG tablet Take 1 tablet (15 mg total) by mouth daily. (Patient not taking: Reported on 05/07/2022) 30 tablet 0   methocarbamol (ROBAXIN) 500 MG tablet Take 1 tablet (500 mg total) by mouth 4 (four) times daily. (Patient not taking: Reported on 05/07/2022) 16 tablet 0   metroNIDAZOLE (FLAGYL) 500 MG tablet Take 1 tablet (500 mg total) by mouth 2 (two) times daily. (Patient not taking: Reported on 06/11/2022) 14 tablet 0   predniSONE (DELTASONE) 50 MG tablet Take 1 tablet (50 mg total) by mouth daily with breakfast. (Patient not taking: Reported on 05/07/2022) 5 tablet 0   Prenatal Vit-Fe Fumarate-FA (SM PRENATAL VITAMINS) 28-0.8 MG TABS Take 1 tablet by mouth daily. 30 tablet 0   No current facility-administered medications for this visit.    No Known Allergies  INGA NOLLER is a 41 y.o. year old female  with a reported history of diagnoses of. Patient currently presents with **** that she reports she has experienced for a *** time. Patient currently describes both depressive symptoms and anxiety symptoms. She reports significant ***  symptoms, including ***. Although patient endorses these vague suicidal ideations, she denies any current plan, intent, or means to harm herself. She also describes ***. Patient reports that these symptoms significantly impact her functioning in multiple life domains.   Due to the above symptoms and patient's reported history, patient is diagnosed with Major Depressive Disorder, recurrent episode, Moderate and Generalized Anxiety Disorder, With panic attacks. Patient's mood symptoms should continue to be monitored closely to provide further diagnosis clarification. Continued mental health treatment is needed to address patient's symptoms and monitor her safety and stability. Patient is  recommended for psychiatric medication management evaluation and continued outpatient therapy to further reduce her symptoms and improve her coping strategies.    There is no acute risk for suicide or violence at this time.  While future psychiatric events cannot be accurately predicted, the patient does not require acute inpatient psychiatric care and does not currently meet [facility] involuntary commitment criteria.  Diagnoses:  No diagnosis found.  Plan of Care:  Patient's goal of treatment is   -LCSW provided brief psychoeducation and a rational for use of CBT's.  -LCSW and patient agreed to develop a treatment plan at next session.    Future Appointments  Date Time Provider [place]  06/27/2022  4:00 PM Milton Ferguson, [place] [facility] None  07/05/2022  1:20 PM [facility] PROVIDER [facility] None     Milton Ferguson, LCSW

## 2022-06-30 ENCOUNTER — Emergency Department
Admission: EM | Admit: 2022-06-30 | Discharge: 2022-06-30 | Disposition: A | Discharge status: Home / Self Care | Payer: Medicaid Other | Attending: Emergency Medicine | Admitting: Emergency Medicine

## 2022-06-30 ENCOUNTER — Emergency Department: Payer: Medicaid Other

## 2022-06-30 ENCOUNTER — Other Ambulatory Visit: Payer: Self-pay

## 2022-06-30 DIAGNOSIS — J101 Influenza due to other identified influenza virus with other respiratory manifestations: Secondary | ICD-10-CM | POA: Insufficient documentation

## 2022-06-30 DIAGNOSIS — Z1152 Encounter for screening for COVID-19: Secondary | ICD-10-CM | POA: Insufficient documentation

## 2022-06-30 DIAGNOSIS — R059 Cough, unspecified: Secondary | ICD-10-CM | POA: Diagnosis present

## 2022-06-30 LAB — CBC WITH DIFFERENTIAL/PLATELET
Abs Immature Granulocytes: 0.27 10*3/uL — ABNORMAL HIGH (ref 0.00–0.07)
Basophils Absolute: 0 10*3/uL (ref 0.0–0.1)
Basophils Relative: 0 %
Eosinophils Absolute: 0.1 10*3/uL (ref 0.0–0.5)
Eosinophils Relative: 1 %
HCT: 34.3 % — ABNORMAL LOW (ref 36.0–46.0)
Hemoglobin: 11.2 g/dL — ABNORMAL LOW (ref 12.0–15.0)
Immature Granulocytes: 2 %
Lymphocytes Relative: 9 %
Lymphs Abs: 1.4 10*3/uL (ref 0.7–4.0)
MCH: 32.6 pg (ref 26.0–34.0)
MCHC: 32.7 g/dL (ref 30.0–36.0)
MCV: 99.7 fL (ref 80.0–100.0)
Monocytes Absolute: 0.8 10*3/uL (ref 0.1–1.0)
Monocytes Relative: 5 %
Neutro Abs: 13.2 10*3/uL — ABNORMAL HIGH (ref 1.7–7.7)
Neutrophils Relative %: 83 %
Platelets: 328 10*3/uL (ref 150–400)
RBC: 3.44 MIL/uL — ABNORMAL LOW (ref 3.87–5.11)
RDW: 13.5 % (ref 11.5–15.5)
WBC: 15.8 10*3/uL — ABNORMAL HIGH (ref 4.0–10.5)
nRBC: 0 % (ref 0.0–0.2)

## 2022-06-30 LAB — URINALYSIS, ROUTINE W REFLEX MICROSCOPIC
Bacteria, UA: NONE SEEN
Bilirubin Urine: NEGATIVE
Glucose, UA: NEGATIVE mg/dL
Hgb urine dipstick: NEGATIVE
Ketones, ur: NEGATIVE mg/dL
Leukocytes,Ua: NEGATIVE
Nitrite: NEGATIVE
Protein, ur: NEGATIVE mg/dL
Specific Gravity, Urine: 1.023 (ref 1.005–1.030)
pH: 7 (ref 5.0–8.0)

## 2022-06-30 LAB — RESP PANEL BY RT-PCR (RSV, FLU A&B, COVID)  RVPGX2
Influenza A by PCR: POSITIVE — AB
Influenza B by PCR: NEGATIVE
Resp Syncytial Virus by PCR: NEGATIVE
SARS Coronavirus 2 by RT PCR: NEGATIVE

## 2022-06-30 LAB — COMPREHENSIVE METABOLIC PANEL
ALT: 19 U/L (ref 0–44)
AST: 21 U/L (ref 15–41)
Albumin: 3.6 g/dL (ref 3.5–5.0)
Alkaline Phosphatase: 48 U/L (ref 38–126)
Anion gap: 11 (ref 5–15)
BUN: 8 mg/dL (ref 6–20)
CO2: 20 mmol/L — ABNORMAL LOW (ref 22–32)
Calcium: 9.1 mg/dL (ref 8.9–10.3)
Chloride: 102 mmol/L (ref 98–111)
Creatinine, Ser: 0.45 mg/dL (ref 0.44–1.00)
GFR, Estimated: >60 mL/min (ref >60)
Glucose, Bld: 101 mg/dL — ABNORMAL HIGH (ref 70–99)
Potassium: 3.9 mmol/L (ref 3.5–5.1)
Sodium: 133 mmol/L — ABNORMAL LOW (ref 135–145)
Total Bilirubin: 0.4 mg/dL (ref 0.3–1.2)
Total Protein: 7 g/dL (ref 6.5–8.1)

## 2022-06-30 LAB — TROPONIN I (HIGH SENSITIVITY): Troponin I (High Sensitivity): 5 ng/L (ref <18)

## 2022-06-30 LAB — D-DIMER, QUANTITATIVE: D-Dimer, Quant: 0.75 ug/mL-FEU — ABNORMAL HIGH (ref 0.00–0.50)

## 2022-06-30 LAB — LACTIC ACID, PLASMA: Lactic Acid, Venous: 1.2 mmol/L (ref 0.5–1.9)

## 2022-06-30 MED ORDER — OSELTAMIVIR PHOSPHATE 75 MG PO CAPS: 75.0000 mg | ORAL_CAPSULE | Freq: Two times a day (BID) | ORAL | 0 refills | Status: AC

## 2022-06-30 MED ORDER — ACETAMINOPHEN 325 MG PO TABS
650.0000 mg | ORAL_TABLET | Freq: Once | ORAL | Status: AC
  Administered 2022-06-30: 650 mg via ORAL
  Filled 2022-06-30: qty 2

## 2022-06-30 NOTE — Discharge Instructions (Signed)
Your flu test was positive.  You were started on Tamiflu.  Please take Tylenol per package instructions as needed for body aches or fever.  Patient manage you are highly contagious to others.  Please follow-up with your OB/GYN.  Please return for any new, worsening, or changes symptoms or other concerns including chest pain or trouble breathing or abdominal pain.

## 2022-06-30 NOTE — ED Provider Notes (Signed)
[facility] Provider Note    Event Date/Time   First MD Initiated Contact with Patient 06/30/22 1252     (approximate)   History   Cough   HPI  Kristen Beasley is a 41 y.o. female with a past medical history of substance abuse, anxiety depression, homelessness who presents today for evaluation of cough, chest congestion, fatigue that began today.  Patient reports that her grandchildren are sick with similar symptoms.  She denies chest pain except for when coughing.  She reports that she feels short of breath only with coughing.  No pleurisy.  No lower extremity swelling.  No history of PE or DVT.  She reports that she is approximately [redacted] weeks pregnant.  She denies any abdominal pain or pregnancy concerns.  No vaginal bleeding.  No dysuria.  Patient Active Problem List   Diagnosis Date Noted   UDS +MJ 06/11/22 06/15/2022   Hemoglobin variant detected A2 06/15/2022   Supervision of high risk pregnancy in second trimester 06/11/2022   Smoker 4-10 cpd 06/11/2022   hx cocaine 06/11/2022   Scoliosis 06/11/2022   hx alcohol abuse 06/11/2022   Advanced maternal age in multigravida 41 yo 06/11/2022   UTI (urinary tract infection) during pregnancy dx'd [facility] 06/07/22 06/11/2022   Uterine fibroids affecting pregnancy seen on 06/07/22 u/s 06/11/2022   Low birth weight infants at term x 2 (4#5 and 5#5) 06/11/2022   Previous cesarean section 03/17/2004 06/11/2022   Elevated blood pressure affecting pregnancy in first trimester, 157/119 on 06/06/22 in ER 06/11/2022   Trichomonas infection 05/07/22 06/11/2022   Depression/anxiety affecting pregnancy dx'd 2022 06/11/2022   Short stature 4'11" 06/11/2022   Presence of partial dental prosthetic device 06/11/2022   Homeless 06/11/2022   Anemia affecting pregnancy 06/11/2022          Physical Exam   Triage Vital Signs: ED Triage Vitals  Enc Vitals Group     BP 06/30/22 1225 126/76     Pulse Rate 06/30/22 1225  (!) 114     Resp 06/30/22 1225 18     Temp 06/30/22 1225 99 F (37.2 C)     Temp Source 06/30/22 1225 Oral     SpO2 06/30/22 1224 95 %     Weight --      Height --      Head Circumference --      Peak Flow --      Pain Score 06/30/22 1226 6     Pain Loc --      Pain Edu? --      Excl. in [place]? --     Most recent vital signs: Vitals:   06/30/22 1225 06/30/22 1246  BP: 126/76   Pulse: (!) 114   Resp: 18 (!) 21  Temp: 99 F (37.2 C)   SpO2: 95%     Physical Exam Vitals and nursing note reviewed.  Constitutional:      General: Awake and alert. No acute distress.    Appearance: Normal appearance. The patient is normal weight.  HENT:     Head: Normocephalic and atraumatic.     Mouth: Mucous membranes are moist.  Eyes:     General: PERRL. Normal EOMs        Right eye: No discharge.        Left eye: No discharge.     Conjunctiva/sclera: Conjunctivae normal.  Cardiovascular:     Rate and Rhythm: Normal rate and regular rhythm.  Pulses: Normal pulses.  Pulmonary:     Effort: Pulmonary effort is normal. No respiratory distress.     Breath sounds: Normal breath sounds.  Dry cough on exam.  Able to speak easily in complete sentences. Abdominal:     Abdomen is soft. There is no abdominal tenderness. No rebound or guarding. No distention. Musculoskeletal:        General: No swelling. Normal range of motion.     Cervical back: Normal range of motion and neck supple. No lower extremity swelling or edema or tenderness Skin:    General: Skin is warm and dry.     Capillary Refill: Capillary refill takes less than 2 seconds.     Findings: No rash.  Neurological:     Mental Status: The patient is awake and alert.      ED Results / Procedures / Treatments   Labs (all labs ordered are listed, but only abnormal results are displayed) Labs Reviewed  RESP PANEL BY RT-PCR (RSV, FLU A&B, COVID)  RVPGX2 - Abnormal; Notable for the following components:      Result Value    Influenza A by PCR POSITIVE (*)    All other components within normal limits  CBC WITH DIFFERENTIAL/PLATELET - Abnormal; Notable for the following components:   WBC 15.8 (*)    RBC 3.44 (*)    Hemoglobin 11.2 (*)    HCT 34.3 (*)    Neutro Abs 13.2 (*)    Abs Immature Granulocytes 0.27 (*)    All other components within normal limits  COMPREHENSIVE METABOLIC PANEL - Abnormal; Notable for the following components:   Sodium 133 (*)    CO2 20 (*)    Glucose, Bld 101 (*)    All other components within normal limits  D-DIMER, QUANTITATIVE - Abnormal; Notable for the following components:   D-Dimer, Quant 0.75 (*)    All other components within normal limits  URINALYSIS, ROUTINE W REFLEX MICROSCOPIC - Abnormal; Notable for the following components:   Color, Urine YELLOW (*)    APPearance CLEAR (*)    All other components within normal limits  LACTIC ACID, PLASMA  LACTIC ACID, PLASMA  POC URINE PREG, ED  TROPONIN I (HIGH SENSITIVITY)  TROPONIN I (HIGH SENSITIVITY)     EKG     RADIOLOGY I independently reviewed and interpreted imaging and agree with radiologists findings.     PROCEDURES:  Critical Care performed:   Procedures   MEDICATIONS ORDERED IN ED: Medications  acetaminophen (TYLENOL) tablet 650 mg (has no administration in time range)     IMPRESSION / MDM / ASSESSMENT AND PLAN / ED COURSE  I reviewed the triage vital signs and the nursing notes.   Differential diagnosis includes, but is not limited to, influenza, URI, pneumonia, bronchitis.  Patient is awake and alert, mildly tachycardic on arrival though afebrile and has normal oxygen saturation on room air.  She has runny nose, productive cough, and URI type symptoms.  She has no clinical signs or symptoms of DVT, no pleurisy, no chest pain unless she is coughing, no hemoptysis, no history of PE or DVT.  D-dimer was obtained in triage by the RN, it is mildly elevated, though using the years algorithm for PE  with adjusted D-dimer in pregnant patients, D-dimer is negative.  I have a very low suspicion for pulmonary embolism given lack of pleurisy, clinical signs or symptoms of DVT, hemoptysis, and no previous history of PE or DVT.  X-ray was also obtained in  triage prior to my evaluation and was negative for any acute cardiopulmonary abnormality.  Flu test was positive.  Patient was started on Tamiflu as recommended by up-to-date.  We discussed symptomatic management and strict return precautions.  I also recommend close outpatient follow-up with her OB/GYN.  Patient understands and agrees with plan.  She was discharged in stable condition.   Patient's presentation is most consistent with acute complicated illness / injury requiring diagnostic workup.      FINAL CLINICAL IMPRESSION(S) / ED DIAGNOSES   Final diagnoses:  Influenza A     Rx / DC Orders   ED Discharge Orders          Ordered    oseltamivir (TAMIFLU) 75 MG capsule  2 times daily        06/30/22 1420             Note:  This document was prepared using Dragon voice recognition software and may include unintentional dictation errors.   Emeline Gins 06/30/22 1605    Lucillie Garfinkel, MD 06/30/22 1901

## 2022-06-30 NOTE — ED Triage Notes (Addendum)
Pt c/o cough, chest congestion, fatigue, chest pain with coughing. Pt states she's 14-[redacted] weeks pregnant and is high risk due to high BP/fibroids. Pt is AOX4, productive cough noted. Respirations even and unlabored. Slight wheeze auscultated across lung fields bilaterally. Pt states her grandchildren have been sick and she was exposed to them recently. Pt denies fever, chills, N/V/D, dizziness.

## 2022-07-05 ENCOUNTER — Telehealth: Payer: Self-pay

## 2022-07-05 ENCOUNTER — Ambulatory Visit: Payer: Medicaid Other

## 2022-07-05 ENCOUNTER — Ambulatory Visit: Payer: Medicaid Other | Admitting: Licensed Clinical Social Worker

## 2022-07-05 NOTE — Telephone Encounter (Signed)
[facility] for [place] [facility] 07/05/2022. Call to client and left message she call and reschedule missed [facility] appt. Number to call provided. Rich Number, RN

## 2022-07-05 NOTE — Progress Notes (Unsigned)
Counselor Initial Adult Exam  Name: Kristen Beasley Date: 07/05/2022 MRN: [id number] DOB: 01/02/1981 PCP: Patient, No Pcp Per  Time spent: ***  A biopsychosocial was completed on the Patient. Background information and current concerns were obtained during an intake in the office with the [facility] Department clinician, Milton Ferguson, LCSW.  Reviewed profession disclosure, contact information and confidentiality was discussed and appropriate consents were signed.      Reason for Visit /Presenting Problem: ***  Mental Status Exam:    Appearance:   {PSY:22683}     Behavior:  {PSY:21022743}  Motor:  {PSY:22302}  Speech/Language:   {PSY:22685}  Affect:  {PSY:22687}  Mood:  {PSY:31886}  Thought process:  {PSY:31888}  Thought content:    {PSY:([phone]}  Sensory/Perceptual disturbances:    {PSY:8208326535}  Orientation:  {PSY:30297}  Attention:  {PSY:22877}  Concentration:  {PSY:([phone]}  Memory:  {PSY:([phone] [phone]}  Fund of knowledge:   {PSY:([phone]}  Insight:    {PSY:([phone]}  Judgment:   {PSY:([phone]}  Impulse Control:  {PSY:([phone]}   Reported Symptoms:  {PSY:364 746 9185}  Risk Assessment: Danger to Self:  {PSY:22692} Self-injurious Behavior: {PSY:22692} Danger to Others: {PSY:22692} Duty to Warn:{PSY:311194} Physical Aggression / Violence:{PSY:21197} Access to Firearms a concern: {PSY:21197} Gang Involvement:{PSY:21197} Patient / guardian was educated about steps to take if suicide or homicide risk level increases between visits: yes While future psychiatric events cannot be accurately predicted, the patient does not currently require acute inpatient psychiatric care and does not currently meet [facility] involuntary commitment criteria.  Substance Abuse History: Current substance abuse: {PSY:21197}    Past Psychiatric History:   {Past psych history:20559} Outpatient Providers:*** History of Psych Hospitalization:  {PSY:21197} Psychological Testing: {PSY:21014032}   Abuse History: Victim of {Abuse History:314532}, {Type of abuse:20566}   Report needed: {PSY:314532} Victim of Neglect:{yes no:314532} Perpetrator of {PSY:20566}  Witness / Exposure to Domestic Violence: {PSY:21197}  Protective Services Involvement: {PSY:21197} Witness to [employer] Violence:  {PSY:21197}  Family History:  Family History  Problem Relation Age of Onset   Diabetes Mother    Cancer Father    Lung cancer Father    Bone cancer Father    Diabetes Father    Diabetes Sister    Diabetes Brother     Social History:  Social History   Socioeconomic History   Marital status: Single    Spouse name: Not on file   Number of children: 2   Years of education: 10   Highest education level: 10th grade  Occupational History   Occupation: Homemaker  Tobacco Use   Smoking status: Every Day    Packs/day: 0.25    Types: Cigarettes    Start date: 1998    Passive exposure: Never   Smokeless tobacco: Never  Vaping Use   Vaping Use: Never used  Substance and Sexual Activity   Alcohol use: Yes    Alcohol/week: 3.0 standard drinks of alcohol    Types: 2 Glasses of wine, 1 Cans of beer per week    Comment: before found out pregnancy   Drug use: Yes    Types: Marijuana    Comment: Uses MJ for anxiety and sleep   Sexual activity: Yes    Partners: Male    Birth control/protection: None, Condom, Injection  Other Topics Concern   Not on file  Social History Narrative   Lives with son.    Social Determinants of Health   Financial Resource Strain: High Risk (06/11/2022)   Overall Financial Resource Strain (CARDIA)    Difficulty of Paying Living  Expenses: Very hard  Food Insecurity: Food Insecurity Present (06/11/2022)   Hunger Vital Sign    Worried About Running Out of Food in the Last Year: Sometimes true    Ran Out of Food in the Last Year: Sometimes true  Transportation Needs: No Transportation Needs (06/11/2022)    PRAPARE - Hydrologist (Medical): No    Lack of Transportation (Non-Medical): No  Physical Activity: Not on file  Stress: Not on file  Social Connections: Not on file    Living situation: the patient {lives:315711::"lives with their family"}  Sexual Orientation:  {Sexual Orientation:[phone]}  Relationship Status: {Desc; marital status:62}  Name of spouse / other:***             If a parent, number of children / ages:***  Support Systems; {DIABETES SUPPORT:20310}  Financial Stress:  {YES/NO:21197}  Income/Employment/Disability: Geneticist, 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[employer]  Educational History: Education: {PSY :31912}  Religion/Sprituality/World View:   {CHL AMB RELIGION/SPIRITUALITY:7698678289}  Any cultural differences that may affect / interfere with treatment:  {Religious/Cultural:200019}  Recreation/Hobbies: {Woc hobbies:30428}  Stressors:{PATIENT STRESSORS:22669}  Strengths:  {Patient Coping Strengths:([phone]}  Barriers:  ***   Legal History: Pending legal issue / charges: {PSY:20588} History of legal issue / charges: {Legal Issues:[phone]}  Medical History/Surgical History:reviewed Past Medical History:  Diagnosis Date   Anxiety    Asthma    Depression    Scoliosis     Past Surgical History:  Procedure Laterality Date   CESAREAN SECTION     DENTAL SURGERY Right     Medications: Current Outpatient Medications  Medication Sig Dispense Refill   acetaminophen (TYLENOL) 325 MG tablet Take Tylenol 1 to 2 pills no more than 4 times a day as needed for pain 100 tablet 2   albuterol (PROVENTIL HFA;VENTOLIN HFA) 108 (90 Base) MCG/ACT inhaler Inhale 2 puffs into the lungs every 4 (four) hours as needed for wheezing or shortness of breath. (Patient not taking: Reported on 06/11/2022) 1 Inhaler 0   azithromycin (ZITHROMAX Z-PAK) 250 MG tablet Take 2 tablets (500 mg) on  Day 1,  followed by 1 tablet (250 mg)  once daily on Days 2 through 5. (Patient not taking: Reported on 06/11/2022) 6 each 0   Iron, Ferrous Sulfate, 325 (65 Fe) MG TABS Take 1 tablet by mouth daily. 100 tablet 0   meloxicam (MOBIC) 15 MG tablet Take 1 tablet (15 mg total) by mouth daily. (Patient not taking: Reported on 05/07/2022) 30 tablet 0   methocarbamol (ROBAXIN) 500 MG tablet Take 1 tablet (500 mg total) by mouth 4 (four) times daily. (Patient not taking: Reported on 05/07/2022) 16 tablet 0   metroNIDAZOLE (FLAGYL) 500 MG tablet Take 1 tablet (500 mg total) by mouth 2 (two) times daily. (Patient not taking: Reported on 06/11/2022) 14 tablet 0   oseltamivir (TAMIFLU) 75 MG capsule Take 1 capsule (75 mg total) by mouth 2 (two) times daily for 5 days. 10 capsule 0   predniSONE (DELTASONE) 50 MG tablet Take 1 tablet (50 mg total) by mouth daily with breakfast. (Patient not taking: Reported on 05/07/2022) 5 tablet 0   Prenatal Vit-Fe Fumarate-FA (SM PRENATAL VITAMINS) 28-0.8 MG TABS Take 1 tablet by mouth daily. 30 tablet 0   No current facility-administered medications for this visit.    No Known Allergies  Diagnoses:  No diagnosis found.  Plan of Care: ***  Future Appointments  Date Time Provider [place]  07/05/2022  1:20 PM [facility] PROVIDER [facility]  None  07/05/2022  3:10 PM Milton Ferguson, LCSW [facility] None     Milton Ferguson, LCSW

## 2022-07-06 ENCOUNTER — Encounter: Payer: Self-pay | Admitting: Advanced Practice Midwife

## 2022-07-06 ENCOUNTER — Encounter: Payer: Self-pay | Admitting: Family Medicine

## 2022-07-06 DIAGNOSIS — O418X90 Other specified disorders of amniotic fluid and membranes, unspecified trimester, not applicable or unspecified: Secondary | ICD-10-CM | POA: Insufficient documentation

## 2022-07-06 DIAGNOSIS — J111 Influenza due to unidentified influenza virus with other respiratory manifestations: Secondary | ICD-10-CM | POA: Insufficient documentation

## 2022-07-06 DIAGNOSIS — O418X10 Other specified disorders of amniotic fluid and membranes, first trimester, not applicable or unspecified: Secondary | ICD-10-CM

## 2022-07-06 DIAGNOSIS — O0992 Supervision of high risk pregnancy, unspecified, second trimester: Secondary | ICD-10-CM

## 2022-07-06 NOTE — Progress Notes (Unsigned)
Patient ID: Mervin Hack, female   DOB: [birth year], 41 y.o.   MRN: [id number] [place] Conference Date: 07/06/22  ROBBYE DEDE was identified by clinical staff to benefit from an interdisciplinary team approach to help improve pregnancy care.  The [place] includes the maternity clinic coordinator (RN), medical providers (MD/APP staff), Care Management -OBCM and [facility], Centering Pregnancy coordinator, Infant Mortality reduction Geophysical data processor.  Nursing staff are also encouraged to participate. The group meets monthly to discuss patient care and coordinate services.   The patient's care care at the agency was reviewed in EMR and high risk factors evaluated in an interdisciplinary approach.    Value added interventions discussed at this care conference today were:   [facility] Meeting 06/28/2022 Patient was a no show to therapy appt with AM Case management RT is working with the patient and states that patient is stable with family. Patient wants to live alone and is currently apartment hunting. L.Rojas

## 2022-07-12 ENCOUNTER — Telehealth: Payer: Self-pay

## 2022-07-12 NOTE — Telephone Encounter (Signed)
Patient returned phone call. Schedule return OB  visit for next week. BThiele RN

## 2022-07-18 ENCOUNTER — Telehealth: Payer: Self-pay

## 2022-07-18 ENCOUNTER — Ambulatory Visit: Payer: Medicaid Other

## 2022-07-18 NOTE — Telephone Encounter (Signed)
[facility] for [place] RV 07/18/2022. Call to client and left message to call and reschedule missed appt. Number to call provided. Rich Number, RN

## 2022-07-19 NOTE — Telephone Encounter (Signed)
[facility] for [place] RV 07/18/2022. Call to patient and left message to call and reschedule missed appt. Number to call provided.   Al Decant, RN

## 2022-07-20 NOTE — Telephone Encounter (Signed)
[facility] for [place] RV 07/18/2022. Call to patient and left message to call and reschedule missed appt. Number to call provided.    Al Decant, RN

## 2022-07-24 NOTE — Telephone Encounter (Signed)
Call to client to reschedule missed [facility] RV appt (transportation issues per client). Appt rescheduled for 07/26/2022. Rich Number, RN

## 2022-07-26 ENCOUNTER — Ambulatory Visit: Payer: Medicaid Other

## 2022-07-26 ENCOUNTER — Telehealth: Payer: Self-pay

## 2022-07-26 NOTE — Telephone Encounter (Signed)
Call to patient regarding missed [place] RV appt for 07/26/22. No answer and no availability to LVM.   Patient to call [phone] to reschedule missed MH RV.   Al Decant, RN

## 2022-07-27 ENCOUNTER — Telehealth: Payer: Self-pay

## 2022-07-27 NOTE — Addendum Note (Signed)
Addended by: Cletis Media on: 07/27/2022 03:25 PM   Modules accepted: Orders

## 2022-07-27 NOTE — Telephone Encounter (Signed)
Encounter opened in error. Rich Number, RN

## 2022-07-27 NOTE — Telephone Encounter (Signed)
[facility] for [place] RV 07/26/2022. Call to client to reschedule appt and per recorded message, voicemail box is full. Emergency contact (daughter) has same phone # as client. Note added to pink sticky to verify phone numbers. Rich Number, RN

## 2022-07-31 NOTE — Telephone Encounter (Signed)
[facility] for [place] RV 07/26/2022. Call to patient to reschedule appt and per recorded message, voicemail box is full.   Emergency contact (daughter) has same phone # as patient.   Al Decant, RN

## 2022-08-01 NOTE — Telephone Encounter (Signed)
[facility] for [place] RV 07/26/2022. Call to patient to reschedule appt and per recorded message, voicemail box is full.    Emergency contact (daughter) has same phone # as patient.    Al Decant, RN

## 2022-08-03 NOTE — Telephone Encounter (Signed)
Call to client and missed [facility] RV appt rescheduled for 08/09/2022 (unable to come for appt on Monday or Tuesday next week). Rich Number, RN

## 2022-08-09 ENCOUNTER — Telehealth: Payer: Self-pay

## 2022-08-09 NOTE — Progress Notes (Deleted)
[facility]  PRENATAL VISIT NOTE  Subjective:  Kristen Beasley is a 42 y.o. [id number] at 43w3dbeing seen today for ongoing prenatal care.  She is currently monitored for the following issues for this {Blank single:19197::"high-risk","low-risk"} pregnancy and has Supervision of high risk pregnancy in second trimester; Smoker 4-10 cpd; hx cocaine; Scoliosis; hx alcohol abuse; Advanced maternal age in multigravida 42yo; [facility]; Uterine fibroids x4 affecting pregnancy seen on 06/07/22 u/s; Low birth weight infants at term x 2 (4#5 and 5#5); Previous cesarean section 03/17/2004; Elevated blood pressure affecting pregnancy in first trimester, 157/119 on 06/06/22 in ER; Trichomonas infection 05/07/22; Depression/anxiety affecting pregnancy dx'd 2022; Short stature 4'11"; Presence of partial dental prosthetic device; Homeless; Anemia affecting pregnancy; UDS +MJ 06/11/22; Hemoglobin variant detected A2; Flu dx'd 06/30/22 in ER; and Subchorionic hemorrhage on 06/07/22 and 06/18/22 u/s on their problem list.  Patient reports {sx:14538}.   .  .   . ***Denies leaking of fluid/ROM.   The following portions of the patient's history were reviewed and updated as appropriate: allergies, current medications, past family history, past medical history, past social history, past surgical history and problem list. Problem list updated.  Objective:  There were no vitals filed for this visit.  Fetal Status:           General:  Alert, oriented and cooperative. Patient is in no acute distress.  Skin: Skin is warm and dry. No rash noted.   Cardiovascular: Normal heart rate noted  Respiratory: Normal respiratory effort, no problems with respiration noted  Abdomen: Soft, gravid, appropriate for gestational age.        Pelvic: {Blank single:19197::"Cervical exam performed","Cervical exam deferred"}        Extremities:  Normal range of motion.     Mental Status: Normal mood and affect. Normal behavior. Normal judgment and thought content.   Assessment and Plan:  Pregnancy: [id number] 285w3d1. Supervision of high risk pregnancy in second trimester -weight gain? -add non complaince prenatal care???  2. Subchorionic hemorrhage of placenta in second trimester, single or unspecified fetus ***  3. Elevated blood pressure affecting pregnancy in first trimester, 157/119 on 06/06/22 in ER -BP today...  4.55Multigravida of advanced maternal age in second trimester ***  5. Anemia affecting pregnancy in second trimester -recheck Hgb today  6. [facility]. Hemoglobin variant detected A2 ***  8. Previous cesarean section 03/17/2004 -will need TOLAC/ delivery apt at 32 weeks  9. Homeless ***  10. Smoker 4-10 cpd -current use?  11. hx cocaine -last use?  12. hx alcohol abuse -last use? 04/2022?  13. UDS +MJ 06/11/22 -current use?   {Blank single:19197::"Term","Preterm"} labor symptoms and general obstetric precautions including but not limited to vaginal bleeding, contractions, leaking of fluid and fetal movement were reviewed in detail with the patient. Please refer to After Visit Summary for other counseling recommendations.  No follow-ups on file.  Future Appointments  Date Time Provider [place]1/25/2024  1:40 PM AC-MH PROVIDER AC-MAT None    HeSharlet SalinaFNP

## 2022-08-09 NOTE — Telephone Encounter (Signed)
[facility] for [place] RV 08/09/2022. Call to client to reschedule appt and voicemail box full. Emergency contact and client have the same number.   Lindaann Slough MSW - OB Care Manager aware client [facility] today and has been 2 months since last appt in [facility]. Rich Number, RN

## 2022-08-10 NOTE — Progress Notes (Signed)
Client [facility] 08/09/2022. Rich Number, RN

## 2022-08-13 NOTE — Telephone Encounter (Signed)
Call to client to reschedule missed [facility] RV appt. Left message to call with number to call provided. Rich Number, RN

## 2022-08-15 ENCOUNTER — Telehealth: Payer: Self-pay

## 2022-08-15 NOTE — Telephone Encounter (Addendum)
[facility] for [place] RV 08/09/2022. Call to patient and she states she could not come to appointment due to transportation issues. Made appointment for soonest available on 08/20/22 for 1:40 arrival time. Informed patient I will speak to [facility] Care Manager, Jonette Eva regarding possibility to arrange for transportation to her appointments.   Al Decant, RN   Call to Kieth Brightly, MSW - [place] and LVM regarding transportation need for 08/20/22 appointments.   Al Decant, RN

## 2022-08-20 ENCOUNTER — Ambulatory Visit: Payer: Medicaid Other

## 2022-08-20 ENCOUNTER — Telehealth: Payer: Self-pay

## 2022-08-20 NOTE — Telephone Encounter (Signed)
Call received from client and missed [facility] RV appt from today rescheduled for 08/22/2022 with arrival time of 1 pm. Rich Number, RN

## 2022-08-20 NOTE — Telephone Encounter (Signed)
[facility] for [facility] RV appt 08/20/2022. Call to client and left voicemail message requesting she call and reschedule appt. Lindaann Slough MSW, Care Manager phone numbers provided to client in voicemail message. Rich Number, RN

## 2022-08-22 ENCOUNTER — Ambulatory Visit: Payer: Medicaid Other | Admitting: Advanced Practice Midwife

## 2022-08-22 VITALS — BP 119/74 | HR 85 | Temp 97.3°F | Wt 143.2 lb

## 2022-08-22 DIAGNOSIS — F1410 Cocaine abuse, uncomplicated: Secondary | ICD-10-CM

## 2022-08-22 DIAGNOSIS — O09892 Supervision of other high risk pregnancies, second trimester: Secondary | ICD-10-CM

## 2022-08-22 DIAGNOSIS — R825 Elevated urine levels of drugs, medicaments and biological substances: Secondary | ICD-10-CM

## 2022-08-22 DIAGNOSIS — F17200 Nicotine dependence, unspecified, uncomplicated: Secondary | ICD-10-CM | POA: Diagnosis not present

## 2022-08-22 DIAGNOSIS — O0992 Supervision of high risk pregnancy, unspecified, second trimester: Secondary | ICD-10-CM

## 2022-08-22 DIAGNOSIS — Z5900 Homelessness unspecified: Secondary | ICD-10-CM

## 2022-08-22 DIAGNOSIS — F1010 Alcohol abuse, uncomplicated: Secondary | ICD-10-CM

## 2022-08-22 DIAGNOSIS — Z91199 Patient's noncompliance with other medical treatment and regimen due to unspecified reason: Secondary | ICD-10-CM

## 2022-08-22 DIAGNOSIS — F32A Depression, unspecified: Secondary | ICD-10-CM

## 2022-08-22 DIAGNOSIS — O99340 Other mental disorders complicating pregnancy, unspecified trimester: Secondary | ICD-10-CM

## 2022-08-22 DIAGNOSIS — O99012 Anemia complicating pregnancy, second trimester: Secondary | ICD-10-CM

## 2022-08-22 DIAGNOSIS — O09522 Supervision of elderly multigravida, second trimester: Secondary | ICD-10-CM

## 2022-08-22 DIAGNOSIS — Z98891 History of uterine scar from previous surgery: Secondary | ICD-10-CM

## 2022-08-22 LAB — URINALYSIS
Bilirubin, UA: NEGATIVE
Glucose, UA: NEGATIVE
Ketones, UA: NEGATIVE
Nitrite, UA: NEGATIVE
Specific Gravity, UA: 1.02 (ref 1.005–1.030)
Urobilinogen, Ur: 0.2 mg/dL (ref 0.2–1.0)
pH, UA: 7.5 (ref 5.0–7.5)

## 2022-08-22 LAB — WET PREP FOR TRICH, YEAST, CLUE
Trichomonas Exam: NEGATIVE
Yeast Exam: NEGATIVE

## 2022-08-22 NOTE — Progress Notes (Signed)
[place] [facility]  PRENATAL VISIT NOTE  Subjective:  Kristen Beasley is a 42 y.o. [id number] at 40w2dbeing seen today for ongoing prenatal care.  She is currently monitored for the following issues for this high-risk pregnancy and has Supervision of high risk pregnancy in second trimester; Smoker 4-10 cpd; hx cocaine; Scoliosis; hx alcohol abuse; Advanced maternal age in multigravida 42yo; [facility] (urinary tract [facility]) [facility]; Uterine fibroids x4 affecting pregnancy seen on 06/07/22 u/s; Low birth weight infants at term x 2 (4#5 and 5#5); Previous cesarean section 03/17/2004; Elevated blood pressure affecting pregnancy in first trimester, 157/119 on 06/06/22 in ER; Trichomonas infection 05/07/22; Depression/anxiety affecting pregnancy dx'd 2022; Short stature 4'11"; Presence of partial dental prosthetic device; Homeless; Anemia affecting pregnancy; UDS +MJ 06/11/22; Hemoglobin variant detected A2; Flu dx'd 06/30/22 in ER; Subchorionic hemorrhage on 06/07/22 and 06/18/22 u/s; and noncompliant; no prenatal care x 10 wks on their problem list.  Patient reports  hasn't had transportation, been sick, no food, couch surfing, using drugs .  Contractions: Not present. Vag. Bleeding: None.  Movement: Present. Denies leaking of fluid/ROM.   The following portions of the patient's history were reviewed and updated as appropriate: allergies, current medications, past family history, past medical history, past social history, past surgical history and problem list. Problem list updated.  Objective:   Vitals:   08/22/22 1338  BP: 119/74  Pulse: 85  Temp: (!) 97.3 F (36.3 C)  Weight: 143 lb 3.2 oz (65 kg)    Fetal Status: Fetal Heart Rate (bpm): 154 Fundal Height: 25 cm Movement: Present     General:  Alert, oriented and cooperative. Patient is in no acute distress.  Skin: Skin is warm and dry. No rash noted.   Cardiovascular: Normal  heart rate noted  Respiratory: Normal respiratory effort, no problems with respiration noted  Abdomen: Soft, gravid, appropriate for gestational age.  Pain/Pressure: Absent     Pelvic: Cervical exam deferred        Extremities: Normal range of motion.  Edema: None  Mental Status: Normal mood and affect. Normal behavior. Normal judgment and thought content.   Assessment and Plan:  Pregnancy: [id number] 278w2d1. Supervision of high risk pregnancy in second trimester No prenatal care x 10 wks; pt states she has had no transportation Reviewed 06/18/22 u/s at 13 6/7 with multiple fibroids, anterior placenta, AFI wnl Anatomy u/s ordered 13 lb 3.2 oz (5.987 kg) 4 lb wt loss in last 10 wks; pt states she doesn't have enough food C/o vaginal dryness since last sex 08/20/22 and 08/21/22 with new partner when condom fell off--wet mount and GC/Chlamydia cultures done U/a with 1+ blood on dip and asymptomatic; C&S done Smoking 4-6 cpd; last MJ yesterday; last cocaine 05/2022 snorted; last ETOH 07/15/22 (2 glasses wine+1 c. Vodka); long discussion with pt regarding [facility] and pt very interested and states will call; [facility] brochure given to pt Pt states has anxiety and hasn't called AmMilton FergusonLCSW yet but interested in counseling and states will call her Pt states FOB unemployed and living with his mom and not supportive and is using drugs as well [facility] with [facility] states not taking prenatal vits and wants gummies--rx written  - Urinalysis (Urine Dip) - WET PREP FOR [place], CLUE - Chlamydia/Gonorrhea [facility]  2. Previous cesarean section 03/17/2004 Desires TOLAC; will need VBAC calculator 32 wks  3. Smoker 4-10 cpd Trying to quit 1800 quit line # given  4. Homeless [place], sleeping in [place]; pt states has not been contacted by R. Dell Ponto; RN to contact R. Karrie Doffing T/C to Lorayne Bender, RN who came to speak to pt   5. UDS +MJ  06/11/22 Pt states last use yesterday; pt admits to having difficulty stopping and interested in [place] residential program; agrees to UDS +UDS 06/11/22  - 336122 Drug Screen  6. hx alcohol abuse States last use 07/15/22 (2 glasses wine)  7. hx cocaine States last use  8. Anemia affecting pregnancy in second trimester Taking FeSo4 I daily with oj  9. Multigravida of advanced maternal age in second trimester 42 yo NIPS 06/11/22 neg Not taking ASA  10. noncompliant; no prenatal care x 10 wks Pt states no transportation   Preterm labor symptoms and general obstetric precautions including but not limited to vaginal bleeding, contractions, leaking of fluid and fetal movement were reviewed in detail with the patient. Please refer to After Visit Summary for other counseling recommendations.  Return in about 4 weeks (around 09/19/2022) for routine PNC, 28 week labs.  No future appointments.  Herbie Saxon, CNM

## 2022-08-22 NOTE — Progress Notes (Signed)
Pt here for MHRV 23w 2d. This visit, patient was given [facility] card, delivering at [employer], quit line card, constipation information sheet - counseled on what foods to eat, safe for pregnancy otc meds to take for constipation, smoking cessation sheet given, and Plan of Safe Care. Pt given opportunity to ask questions, pt verbalized understanding.  [facility] Anatomy U/S referral form faxed with confirmation received.   Wet prep reviewed, no treatment indicated. Urine dip reviewed by provider, per provider - order urine c&s.  Pt scheduled on 09/19/22 for MHRV, appointment card given, verified phone number in chart with pt.   Harland Dingwall, RN

## 2022-08-24 LAB — URINE CULTURE

## 2022-08-26 LAB — 789231 7+OXYCODONE-BUND
Amphetamines, Urine: NEGATIVE ng/mL
BENZODIAZ UR QL: NEGATIVE ng/mL
Barbiturate screen, urine: NEGATIVE ng/mL
OPIATE SCREEN URINE: NEGATIVE ng/mL
Oxycodone/Oxymorphone, Urine: NEGATIVE ng/mL
PCP Quant, Ur: NEGATIVE ng/mL

## 2022-08-26 LAB — CANNABINOID CONFIRMATION, UR
CANNABINOIDS: POSITIVE — AB
Carboxy THC GC/MS Conf: 561 ng/mL

## 2022-08-26 LAB — COCAINE CONF, UR
Benzoylecgonine GC/MS Conf: 605 ng/mL
Cocaine Metab Quant, Ur: POSITIVE — AB

## 2022-08-27 ENCOUNTER — Telehealth: Payer: Self-pay

## 2022-08-27 NOTE — Telephone Encounter (Signed)
Received fax from [facility], unable to get in touch with patient to schedule U/S. Tried calling patient with no answer, cannot leave a message, mailbox is full.

## 2022-08-28 ENCOUNTER — Telehealth: Payer: Self-pay

## 2022-08-28 DIAGNOSIS — O98219 Gonorrhea complicating pregnancy, unspecified trimester: Secondary | ICD-10-CM | POA: Insufficient documentation

## 2022-08-28 LAB — N. GONORRHOEAE NAA, CONFIRM: N. gonorrhoeae NAA, Confirm: POSITIVE — AB

## 2022-08-28 LAB — CHLAMYDIA/GC NAA, CONFIRMATION
Chlamydia trachomatis, NAA: NEGATIVE
Neisseria gonorrhoeae, NAA: POSITIVE — AB

## 2022-08-28 NOTE — Telephone Encounter (Addendum)
Call to client to share STI test results and to make an appt for treatment. No answer, straight to VM, cannot leave a message due to mailbox being full.   Made a call to [place] that unable to reach patient & states that she will let SW Rasheda know since she is the case manager for pt.

## 2022-08-29 ENCOUNTER — Telehealth: Payer: Self-pay

## 2022-08-29 NOTE — Telephone Encounter (Signed)
Call to client to provide her number to call at [facility] to schedule OB US appt as they have made unsuccessful attempts x3 to contact her by phone. Per recorded message, voicemail box is full. Secure chat to Kieth Brightly MSW, [facility] Care Manager requesting assistance contacting client. Rich Number, RN

## 2022-08-29 NOTE — Telephone Encounter (Signed)
Call to client as needs treatment for gonorrhea. Per recorded message, voicemail box is full. Emergency contact is same number as client. Secure chat sent to R. Karrie Doffing MSW, [place] requesting assistance contacting client. Rich Number, RN

## 2022-08-29 NOTE — Telephone Encounter (Signed)
Per secure chat from R. Karrie Doffing MSW, [facility] Care Manager she has attempted to call client multiple times this am without success. As off work this pm, she states will attempt home visit tomorrow. Rich Number, RN

## 2022-08-29 NOTE — Telephone Encounter (Signed)
Per secure chat from R. Karrie Doffing MSW, [facility] Care Manager she has been unsuccessful in contacting client via phone this am. As off work this pm, she states will attempt home visit tomorrow. Rich Number, RN

## 2022-08-30 ENCOUNTER — Telehealth: Payer: Self-pay

## 2022-08-30 NOTE — Telephone Encounter (Signed)
Per R. [facility] MSW-OB Care Manager, client can come tomorrow for gonorrhea treatment and appt for 08/31/2022 scheduled. Ms. Karrie Doffing also counseled client on need to contact [facility] to schedule [place] appt and scheduling number given to client. Rich Number, RN

## 2022-08-30 NOTE — Telephone Encounter (Signed)
Per R. Karrie Doffing MSW-OB Case Manager, client can come for gonorrhea treatment tomorrow. Appt scheduled for tomorrow. Ms. Karrie Doffing also provided client info / phone number to call and schedule [facility] [place] appt. Rich Number, RN

## 2022-08-31 ENCOUNTER — Ambulatory Visit: Payer: Medicaid Other

## 2022-08-31 ENCOUNTER — Ambulatory Visit: Payer: Medicaid Other | Admitting: Advanced Practice Midwife

## 2022-08-31 VITALS — BP 129/81 | HR 93 | Temp 97.4°F | Wt 144.2 lb

## 2022-08-31 DIAGNOSIS — O98212 Gonorrhea complicating pregnancy, second trimester: Secondary | ICD-10-CM

## 2022-08-31 MED ORDER — CEFTRIAXONE SODIUM 500 MG IJ SOLR
500.0000 mg | Freq: Once | INTRAMUSCULAR | Status: AC
  Administered 2022-08-31: 500 mg via INTRAMUSCULAR

## 2022-08-31 NOTE — Progress Notes (Signed)
Patient here for [place] OB Problem at [redacted]w[redacted]d   Patient given and explained PTL handout.   Treated today in clinic for gonorrhea. Ceftriaxone 5023mIM given RUOQ - tolerated well. Education given and contact card given so her partner can get treated. Report card filled out.   Gave patient [facility] [place]ment to call and schedule anatomy US[place]oreaPatient schedule Anat US[place]oreauring clinic today. Anat US[place]oreacheduled for 09/17/22 at 2pm. Patient verbalized understanding. Address printed and handed to her.   [place] for [place]22 1pm arrival time.   EvAl DecantRN

## 2022-09-19 ENCOUNTER — Telehealth: Payer: Self-pay

## 2022-09-19 ENCOUNTER — Ambulatory Visit: Payer: 59

## 2022-09-19 ENCOUNTER — Ambulatory Visit: Payer: 59 | Admitting: Licensed Clinical Social Worker

## 2022-09-19 NOTE — Telephone Encounter (Signed)
[facility] for [place] RV 09/19/2022 and R. Karrie Doffing MSW, OB Cre Manager aware.  This RN called client and per recorded message, voicemail box is full. Client and emergency contact have the same phone number. Rich Number, RN

## 2022-09-20 NOTE — Telephone Encounter (Signed)
[facility] for [place] RV 09/19/2022.   Spoke with patient and scheduled appointment for Monday March 11th for 2:20 arrival time. Patient verbalized understanding.   Will message Rasheda, [place] Care Manager to let her know.   Al Decant, RN

## 2022-09-24 ENCOUNTER — Telehealth: Payer: Self-pay

## 2022-09-24 ENCOUNTER — Ambulatory Visit: Payer: 59

## 2022-09-24 NOTE — Telephone Encounter (Signed)
[facility] for [place] RV 09/24/2022.  Call to client to reschedule missed appt and per recorded message, voicemail box is full.  Client and emergency contact have the same phone number. Rich Number, RN

## 2022-09-25 NOTE — Telephone Encounter (Signed)
Call to patient to reschedule missed [facility] RV on 09/24/22. Patient answered and scheduled appointment for 10/02/22 at [place] arrival time. Patient verbalized understanding.   Al Decant, RN

## 2022-10-02 ENCOUNTER — Telehealth: Payer: Self-pay

## 2022-10-02 ENCOUNTER — Ambulatory Visit: Payer: 59

## 2022-10-02 NOTE — Telephone Encounter (Signed)
Call to patient regarding missed MH RV on 10/02/22. Patient did not answer and VM box is full, unable to leave message.   Call to [place], [place] care manager regarding this patient missed appt and LVM if she could contact patient as well.   Al Decant, RN

## 2022-10-03 NOTE — Telephone Encounter (Signed)
Call to patient regarding missed MH RV on 10/02/22. Patient did not answer and VM box is full, unable to leave message.    No emergency contact listed.    Royston Sinner care manager now aware that patient missed appointment and needs to be contacted.    Al Decant, RN

## 2022-10-04 NOTE — Telephone Encounter (Signed)
Call to patient regarding missed MH RV on 10/02/22.   Patient answered and appointment rescheduled for 10/11/22 at 1pm arrival time. Patient aware it is a work in appointment as patient is due for 28 week labs. Patient verbalized understanding. She stated she could not make it to previous appt due to family emergency.    Al Decant, RN

## 2022-10-11 ENCOUNTER — Encounter: Payer: Self-pay | Admitting: Physician Assistant

## 2022-10-11 ENCOUNTER — Ambulatory Visit: Payer: 59 | Admitting: Physician Assistant

## 2022-10-11 VITALS — BP 125/75 | HR 92 | Temp 97.3°F | Wt 146.6 lb

## 2022-10-11 DIAGNOSIS — R825 Elevated urine levels of drugs, medicaments and biological substances: Secondary | ICD-10-CM | POA: Diagnosis not present

## 2022-10-11 DIAGNOSIS — F1410 Cocaine abuse, uncomplicated: Secondary | ICD-10-CM

## 2022-10-11 DIAGNOSIS — O0992 Supervision of high risk pregnancy, unspecified, second trimester: Secondary | ICD-10-CM

## 2022-10-11 DIAGNOSIS — Z91199 Patient's noncompliance with other medical treatment and regimen due to unspecified reason: Secondary | ICD-10-CM

## 2022-10-11 DIAGNOSIS — O98219 Gonorrhea complicating pregnancy, unspecified trimester: Secondary | ICD-10-CM

## 2022-10-11 DIAGNOSIS — Z23 Encounter for immunization: Secondary | ICD-10-CM | POA: Diagnosis not present

## 2022-10-11 DIAGNOSIS — O99013 Anemia complicating pregnancy, third trimester: Secondary | ICD-10-CM

## 2022-10-11 DIAGNOSIS — Z98891 History of uterine scar from previous surgery: Secondary | ICD-10-CM

## 2022-10-11 DIAGNOSIS — O09892 Supervision of other high risk pregnancies, second trimester: Secondary | ICD-10-CM

## 2022-10-11 DIAGNOSIS — O0993 Supervision of high risk pregnancy, unspecified, third trimester: Secondary | ICD-10-CM

## 2022-10-11 DIAGNOSIS — Z3A30 30 weeks gestation of pregnancy: Secondary | ICD-10-CM

## 2022-10-11 LAB — HEMOGLOBIN, FINGERSTICK: Hemoglobin: 10.4 g/dL — ABNORMAL LOW (ref 11.1–15.9)

## 2022-10-11 NOTE — Progress Notes (Signed)
Patient was here for 28 week labs today. Given [facility] card. Patient aware of [facility] U/S on 10/17/22. Kick counts reviewed with patient and 3 cards given. Tdap given and tolerated well, VIS given, NCIR to be given to patient at next visit. Patient was counseled that she could arrange Medicaid transportation for her appointments by calling the number on the back of her Medicaid card, but she states she does not have a Medicaid card. Lindaann Slough came to clinic to see patient today. Hgb= 10.4 today, patient counseled to continue taking one iron tablet daily with vitamin C rich juice. Patient brought her partner to clinic today to be treated for Gonorrhea, this RN told her he would need an appointment. Clerical to arrange and calling K. Rudd. Patient states she has Peds list, but has not chosen a pediatrician yet.Marland KitchenMarland KitchenJenetta Downer, RN

## 2022-10-11 NOTE — Progress Notes (Signed)
[facility] delivery plans referral faxed with records to [facility]. Confirmation received.Jenetta Downer, RN

## 2022-10-11 NOTE — Progress Notes (Signed)
[place] [facility]  PRENATAL VISIT NOTE  Subjective:  Kristen Beasley is a 42 y.o. [phone] at [redacted]w[redacted]d being seen today for ongoing prenatal care.  She is currently monitored for the following issues for this high-risk pregnancy and has Supervision of high risk pregnancy in second trimester; Smoker 4-10 cpd; hx cocaine; +UDS cocaine 08/22/22; Scoliosis; hx alcohol abuse; Advanced maternal age in multigravida 42 yo; UTI (urinary tract infection) during pregnancy dx'd [facility] ER 06/07/22; Uterine fibroids x4 affecting pregnancy seen on 06/07/22 u/s; Low birth weight infants at term x 2 (4#5 and 5#5); Previous cesarean section 03/17/2004; Elevated blood pressure affecting pregnancy in first trimester, 157/119 on 06/06/22 in ER; Trichomonas infection 05/07/22; Depression/anxiety affecting pregnancy dx'd 2022; Short stature 4'11"; Presence of partial dental prosthetic device; Homeless; Anemia affecting pregnancy; UDS +MJ 06/11/22; +MJ and cocaine 08/22/22; Hemoglobin variant detected A2; Flu dx'd 06/30/22 in ER; Subchorionic hemorrhage on 06/07/22 and 06/18/22 u/s; noncompliant; no prenatal care x 10 wks; and Gonorrhea affecting pregnancy 08/22/22 on their problem list.  Patient reports no complaints.  Contractions: Not present. Vag. Bleeding: None.  Movement: Present. Denies leaking of fluid/ROM.   The following portions of the patient's history were reviewed and updated as appropriate: allergies, current medications, past family history, past medical history, past social history, past surgical history and problem list. Problem list updated.  Objective:   Vitals:   10/11/22 1318  BP: 125/75  Pulse: 92  Temp: (!) 97.3 F (36.3 C)  Weight: 146 lb 9.6 oz (66.5 kg)    Fetal Status: Fetal Heart Rate (bpm): 132 Fundal Height: 31 cm Movement: Present     General:  Alert, oriented and cooperative. Patient is in no acute distress.  Skin: Skin is warm and dry. No rash noted.    Cardiovascular: Normal heart rate noted  Respiratory: Normal respiratory effort, no problems with respiration noted  Abdomen: Soft, gravid, appropriate for gestational age.  Pain/Pressure: Absent     Pelvic: Cervical exam deferred        Extremities: Normal range of motion.  Edema: None  Mental Status: Normal mood and affect. Normal behavior. Normal judgment and thought content.   Assessment and Plan:  Pregnancy: [id number] at [redacted]w[redacted]d  1. Supervision of high risk pregnancy in second trimester Overdue for routine 28-week labs, done today. Transition to every 2-week visits. Enc to keep f/u fetal growth [place] as sched 10/17/22. - RPR - HIV-1/HIV-2 Qualitative RNA - Glucose, 1 hour - Hemoglobin, fingerstick  2. Gonorrhea affecting pregnancy, antepartum Pt reports no sexual contact with partner who has not been treated yet without a condom. Partner in clinic being treated today. TOC for pt today. - Chlamydia/GC NAA, Confirmation  3. Previous cesarean section 03/17/2004 Pt originally wanted TOLAC (predicted chance of successful VBAC 71.9%), but pt now wants repeat C/S. Refer for delivery plans appt and optimal timing (esp in light of mat age 73) at [facility].  4. Anemia affecting pregnancy in third trimester Taking oral iron daily. Recheck hgb today.  5. hx cocaine; +UDS cocaine 08/22/22 Repeat UDS today. [id number] 7+Oxycodone-Bund  6. UDS +MJ 06/11/22; +MJ and cocaine 08/22/22 Repeat UDS today - [id number] 7+Oxycodone-Bund  7. noncompliant; no prenatal care x 10 wks To meet with OBCM R. Karrie Doffing today re: care coordination, housing, transportation - pt reports stressors/barriers to care in these areas. Reports living in [place] and using funds for fuel to warm vehicle so does not have gas for transportation.   Preterm labor symptoms  and general obstetric precautions including but not limited to vaginal bleeding, contractions, leaking of fluid and fetal movement were reviewed in detail with the patient. Please  refer to After Visit Summary for other counseling recommendations.  Note: Pt seen in conjunction with Arnette Schaumann MD, [place] resident. Return in about 2 weeks (around 10/25/2022) for Routine prenatal care.  Future Appointments  Date Time Provider [place]  10/25/2022  1:20 PM [facility]-MH PROVIDER AC-MAT None    Lora Havens, PA-C

## 2022-10-12 LAB — GLUCOSE, 1 HOUR GESTATIONAL: Gestational Diabetes Screen: 145 mg/dL — ABNORMAL HIGH (ref 70–139)

## 2022-10-13 LAB — HIV-1/HIV-2 QUALITATIVE RNA
HIV-1 RNA, Qualitative: NONREACTIVE
HIV-2 RNA, Qualitative: NONREACTIVE

## 2022-10-13 LAB — RPR: RPR Ser Ql: NONREACTIVE

## 2022-10-14 LAB — CHLAMYDIA/GC NAA, CONFIRMATION
Chlamydia trachomatis, NAA: NEGATIVE
Neisseria gonorrhoeae, NAA: NEGATIVE

## 2022-10-15 ENCOUNTER — Telehealth: Payer: Self-pay

## 2022-10-15 DIAGNOSIS — R7309 Other abnormal glucose: Secondary | ICD-10-CM | POA: Insufficient documentation

## 2022-10-15 HISTORY — DX: Other abnormal glucose: R73.09

## 2022-10-15 NOTE — Telephone Encounter (Signed)
TC to client to inform of elevated 1 hour glucose tolerance test (=145). Patient scheduled for 3 hour gtt on 10/19/22 at 0800. Patient counseled to fast, no food or drinks, from midnight before the test until after test is completed. Patient counseled on procedure on day of test and need to be here for the 3 hours of the test. Patient states understanding and denies questions at this time.Jenetta Downer, RN

## 2022-10-17 LAB — 789231 7+OXYCODONE-BUND
Amphetamines, Urine: NEGATIVE ng/mL
BENZODIAZ UR QL: NEGATIVE ng/mL
Barbiturate screen, urine: NEGATIVE ng/mL
OPIATE SCREEN URINE: NEGATIVE ng/mL
Oxycodone/Oxymorphone, Urine: NEGATIVE ng/mL
PCP Quant, Ur: NEGATIVE ng/mL

## 2022-10-17 LAB — COCAINE CONF, UR
Benzoylecgonine GC/MS Conf: 3760 ng/mL
Cocaine Metab Quant, Ur: POSITIVE — AB

## 2022-10-17 LAB — CANNABINOID CONFIRMATION, UR
CANNABINOIDS: POSITIVE — AB
Carboxy THC GC/MS Conf: 381 ng/mL

## 2022-10-18 ENCOUNTER — Encounter: Payer: Self-pay | Admitting: Advanced Practice Midwife

## 2022-10-19 ENCOUNTER — Other Ambulatory Visit: Payer: 59

## 2022-10-19 ENCOUNTER — Telehealth: Payer: Self-pay

## 2022-10-19 NOTE — Telephone Encounter (Signed)
[facility] for 3 hour GTT in Nurse Clinic 10/19/2022. Call to client to reschedule appt and per recorded message, voicemail box is full. Client and emergency contact phone number are the same. Jossie Ng, RN

## 2022-10-22 ENCOUNTER — Telehealth: Payer: Self-pay

## 2022-10-22 NOTE — Telephone Encounter (Signed)
Unable to leave VM to reschedule 3 HR GTT- "voicemail full" BTHIELE RN

## 2022-10-22 NOTE — Telephone Encounter (Signed)
Unable to LM - voice Box full to reschedule 3 HR GTT

## 2022-10-24 NOTE — Telephone Encounter (Signed)
Patient [facility] for 3 hour GTT in Nurse Clinic 10/19/2022.   Call to patient to reschedule appt  - no answer and LVM.   Emergency contact phone number is same as patient's number.   Earlyne Iba, RN

## 2022-10-25 ENCOUNTER — Telehealth: Payer: Self-pay

## 2022-10-25 ENCOUNTER — Ambulatory Visit: Payer: 59 | Admitting: Advanced Practice Midwife

## 2022-10-25 VITALS — BP 118/71 | HR 92 | Temp 97.0°F | Wt 144.2 lb

## 2022-10-25 DIAGNOSIS — O09892 Supervision of other high risk pregnancies, second trimester: Secondary | ICD-10-CM

## 2022-10-25 DIAGNOSIS — D259 Leiomyoma of uterus, unspecified: Secondary | ICD-10-CM

## 2022-10-25 DIAGNOSIS — R825 Elevated urine levels of drugs, medicaments and biological substances: Secondary | ICD-10-CM

## 2022-10-25 DIAGNOSIS — O98219 Gonorrhea complicating pregnancy, unspecified trimester: Secondary | ICD-10-CM

## 2022-10-25 DIAGNOSIS — O99340 Other mental disorders complicating pregnancy, unspecified trimester: Secondary | ICD-10-CM

## 2022-10-25 DIAGNOSIS — O3410 Maternal care for benign tumor of corpus uteri, unspecified trimester: Secondary | ICD-10-CM

## 2022-10-25 DIAGNOSIS — O0992 Supervision of high risk pregnancy, unspecified, second trimester: Secondary | ICD-10-CM

## 2022-10-25 DIAGNOSIS — Z98891 History of uterine scar from previous surgery: Secondary | ICD-10-CM

## 2022-10-25 DIAGNOSIS — R7309 Other abnormal glucose: Secondary | ICD-10-CM

## 2022-10-25 DIAGNOSIS — F1010 Alcohol abuse, uncomplicated: Secondary | ICD-10-CM

## 2022-10-25 DIAGNOSIS — O09522 Supervision of elderly multigravida, second trimester: Secondary | ICD-10-CM

## 2022-10-25 DIAGNOSIS — F1410 Cocaine abuse, uncomplicated: Secondary | ICD-10-CM

## 2022-10-25 DIAGNOSIS — Z91199 Patient's noncompliance with other medical treatment and regimen due to unspecified reason: Secondary | ICD-10-CM

## 2022-10-25 DIAGNOSIS — O99013 Anemia complicating pregnancy, third trimester: Secondary | ICD-10-CM

## 2022-10-25 DIAGNOSIS — F17200 Nicotine dependence, unspecified, uncomplicated: Secondary | ICD-10-CM

## 2022-10-25 DIAGNOSIS — F32A Depression, unspecified: Secondary | ICD-10-CM

## 2022-10-25 LAB — URINALYSIS
Bilirubin, UA: NEGATIVE
Glucose, UA: NEGATIVE
Leukocytes,UA: NEGATIVE
Nitrite, UA: NEGATIVE
Specific Gravity, UA: >1.03 — ABNORMAL HIGH (ref 1.005–1.030)
Urobilinogen, Ur: 0.2 mg/dL (ref 0.2–1.0)
pH, UA: 6.5 (ref 5.0–7.5)

## 2022-10-25 MED ORDER — PRENATAL MULTIVITAMIN CH
1.0000 | ORAL_TABLET | Freq: Every day | ORAL | 0 refills | Status: AC
Start: 2022-10-25 — End: ?
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
## 2022-10-25 NOTE — Progress Notes (Signed)
Patient here for MH RV at [redacted]w[redacted]d.   Urine dip reviewed during clinic visit - urine culture added.   Gave patient appointment reminder for [facility] growth [place] on 10/31/22 at 1530. Also gave reminder for delivery plans appt at [facility] 11/09/22 @ 10:30. Patient reports understanding of these appointments.   3 hr gtt appt scheduled for this Monday 10/29/22, instructions given and written down for patient. Patient aware to not eat past midnight.   Next MH RV appt scheduled here in clinic for 11/08/22 at 0100 arrival time. She is scheduled to see Marchelle Folks at 2pm. Behavioral health consent signed today with Marchelle Folks.   PNV dispensed to patient per request.   Earlyne Iba, RN

## 2022-10-25 NOTE — Telephone Encounter (Signed)
Call to patient as she missed MH RV for 10/25/22.   Patient also needs a 3 hr gtt during her next MH RV.   Please schedule next appt in the morning and give patient instructions for 3 hr gtt lab.   No answer and VM box is full.   Earlyne Iba, RN

## 2022-10-25 NOTE — Telephone Encounter (Signed)
Client has MHC RV appt this pm and will address rescheduling of 3 hour GTT at that time. Jossie Ng, RN

## 2022-10-27 LAB — URINE CULTURE

## 2022-10-29 ENCOUNTER — Other Ambulatory Visit: Payer: 59

## 2022-10-29 NOTE — Telephone Encounter (Signed)
Patient called to reschedule 3 HR GTT. Reviewed with patient instructions and patient verbalized understanding. BTHIELE RN

## 2022-10-30 ENCOUNTER — Other Ambulatory Visit: Payer: 59

## 2022-10-30 LAB — COCAINE CONF, UR
Benzoylecgonine GC/MS Conf: 14400 ng/mL
Cocaine Metab Quant, Ur: POSITIVE — AB

## 2022-10-30 LAB — 789231 7+OXYCODONE-BUND
Amphetamines, Urine: NEGATIVE ng/mL
BENZODIAZ UR QL: NEGATIVE ng/mL
Barbiturate screen, urine: NEGATIVE ng/mL
OPIATE SCREEN URINE: NEGATIVE ng/mL
Oxycodone/Oxymorphone, Urine: NEGATIVE ng/mL
PCP Quant, Ur: NEGATIVE ng/mL

## 2022-10-30 LAB — CANNABINOID CONFIRMATION, UR
CANNABINOIDS: POSITIVE — AB
Carboxy THC GC/MS Conf: 112 ng/mL

## 2022-11-07 ENCOUNTER — Other Ambulatory Visit: Payer: 59

## 2022-11-07 ENCOUNTER — Telehealth: Payer: Self-pay

## 2022-11-07 NOTE — Telephone Encounter (Signed)
Left message for patient that Glucose Tolerance Test is scheduled for this a.m.  and reminded of tomorrow's appointment at 1 p.m. Left message to return phone call to reschedule Glucose Tolerance Test. [facility] RN

## 2022-11-08 ENCOUNTER — Ambulatory Visit: Payer: 59 | Admitting: Licensed Clinical Social Worker

## 2022-11-08 ENCOUNTER — Ambulatory Visit: Payer: 59

## 2022-11-08 NOTE — Telephone Encounter (Signed)
Attempt to call patient - phone seems to be out of service.  Patient missed 3 hr gtt appt 11/07/23 and missed MH RV 11/08/23.   Patient needs a morning appt to have 3 hr gtt and MH RV at the same time.   Earlyne Iba, RN

## 2022-11-12 ENCOUNTER — Telehealth: Payer: Self-pay

## 2022-11-12 NOTE — Telephone Encounter (Signed)
Talked to [facility] and instructions given for 3 HR GTT and scheduled return appointment. BTHIELE RN

## 2022-11-15 ENCOUNTER — Encounter: Payer: Self-pay | Admitting: Physician Assistant

## 2022-11-15 ENCOUNTER — Ambulatory Visit: Payer: 59 | Admitting: Physician Assistant

## 2022-11-15 VITALS — BP 157/103 | HR 82 | Temp 97.8°F | Wt 148.2 lb

## 2022-11-15 DIAGNOSIS — Z3A35 35 weeks gestation of pregnancy: Secondary | ICD-10-CM

## 2022-11-15 DIAGNOSIS — O0993 Supervision of high risk pregnancy, unspecified, third trimester: Secondary | ICD-10-CM

## 2022-11-15 DIAGNOSIS — R7309 Other abnormal glucose: Secondary | ICD-10-CM | POA: Diagnosis not present

## 2022-11-15 DIAGNOSIS — O163 Unspecified maternal hypertension, third trimester: Secondary | ICD-10-CM

## 2022-11-15 DIAGNOSIS — F1410 Cocaine abuse, uncomplicated: Secondary | ICD-10-CM

## 2022-11-15 DIAGNOSIS — O1493 Unspecified pre-eclampsia, third trimester: Secondary | ICD-10-CM | POA: Diagnosis not present

## 2022-11-15 DIAGNOSIS — O99013 Anemia complicating pregnancy, third trimester: Secondary | ICD-10-CM

## 2022-11-15 DIAGNOSIS — Z98891 History of uterine scar from previous surgery: Secondary | ICD-10-CM

## 2022-11-15 LAB — URINALYSIS
Bilirubin, UA: NEGATIVE
Glucose, UA: NEGATIVE
Ketones, UA: NEGATIVE
Nitrite, UA: NEGATIVE
Specific Gravity, UA: 1.025 (ref 1.005–1.030)
Urobilinogen, Ur: 0.2 mg/dL (ref 0.2–1.0)
pH, UA: 7 (ref 5.0–7.5)

## 2022-11-15 NOTE — Progress Notes (Signed)
Repeat BP of 157/103 given to [employer]. Per Ms. Streilein, client needs [facility] L & D evaluation but does not have funds to purchase gas for her [place] or a friend's vehicle (friend may can take her). Darrol Poke MSW, [facility] Care Manager notified of above. Herby Abraham RN, Nursing Director also notified of above and [facility] will provide Benedetto Goad transportation to [place]. Per A. Streilein, client may complete 3 hour GTT prior to L & D evaluation. Client notified of above and aware to notify [facility] will need assistance with transportation home. Darrol Poke notified of above and reaching out to an [facility] regarding transportation home. Also requested she contact Insurance claims handler regarding transportation home. Jossie Ng, RN

## 2022-11-15 NOTE — Progress Notes (Signed)
[facility]  PRENATAL VISIT NOTE  Subjective:  ROSLAND RIDING is a 42 y.o. [phone] at [redacted]w[redacted]d being seen today for ongoing prenatal care.  She is currently monitored for the following issues for this high-risk pregnancy and has Supervision of high risk pregnancy in second trimester; Smoker 1/2 ppd; hx cocaine; +UDS cocaine 08/22/22, 10/11/22; Scoliosis; hx alcohol abuse; Advanced maternal age in multigravida 42 yo; UTI (urinary tract infection) during pregnancy dx'd [facility] ER 06/07/22; Uterine fibroids x4 affecting pregnancy seen on 06/07/22 u/s; Low birth weight infants at term x 2 (4#5 and 5#5); Previous cesarean section 03/17/2004; Elevated blood pressure affecting pregnancy in first trimester, 157/119 on 06/06/22 in ER; Trichomonas infection 05/07/22; Depression/anxiety affecting pregnancy dx'd 2022; Short stature 4'11"; Presence of partial dental prosthetic device; Homeless; Anemia affecting pregnancy; UDS +MJ 06/11/22; +MJ and cocaine 08/22/22; +UDS MJ+cocaine 10/11/22; Hemoglobin variant detected A2; Flu dx'd 06/30/22 in ER; Subchorionic hemorrhage on 06/07/22 and 06/18/22 u/s; noncompliant; no prenatal care x 10 wks; Gonorrhea affecting pregnancy 08/22/22; and Elevated glucose tolerance test 10/11/22 1 hour=145 on their problem list.  Patient reports backache.  Contractions: Not present. Vag. Bleeding: None.  Movement: Present. Continues to live in her [place]. Denies leaking of fluid/ROM.   The following portions of the patient's history were reviewed and updated as appropriate: allergies, current medications, past family history, past medical history, past social history, past surgical history and problem list. Problem list updated.  Objective:   Vitals:   11/15/22 0906  BP: (!) 151/99  Pulse: 82  Temp: 97.8 F (36.6 C)  Weight: 148 lb 3.2 oz (67.2 kg)    Fetal Status: Fetal Heart Rate (bpm): 135 Fundal Height: 36 cm Movement: Present  Presentation:  Vertex  General:  Alert, oriented and cooperative. Patient is in no acute distress.  Skin: Skin is warm and dry. No rash noted.   Cardiovascular: Normal heart rate noted  Respiratory: Normal respiratory effort, no problems with respiration noted  Abdomen: Soft, gravid, appropriate for gestational age.  Pain/Pressure: Absent (ab)     Pelvic: Cervical exam deferred        Extremities: Normal range of motion.  Edema: None  Mental Status: Normal mood and affect. Normal behavior. Normal judgment and thought content.   Assessment and Plan:  Pregnancy: [id number] at [redacted]w[redacted]d 1. Supervision of high risk pregnancy in third trimester 2+ protein on clean catch UA, unable to provide adequate specimen for albumin:creat ratio. Plan routine 36-wk testing at RV. - Urinalysis  2. Elevated glucose tolerance test Pt gives inconsistent hx re: consuming some juice 5 hours prior to presentation, but as she has missed multiple appts for GTT to abnl 1h GTT, and is so late in pregnancy, I elected to proceed with 3h GTT today. If neg, this will be helpful, if pos, will need to consider possible implications of juice ingestion when interpreting this test. Would likely start with dietary recommendations and home blood sugar monitoring first if suspect GDM. - Gestational Glucose Tolerance  3. hx cocaine; +UDS cocaine 08/22/22, 10/11/22 Pt states last cocaine use 3d ago. Declines [facility]. States desire for "clean urine" by delivery, and states intention not to use until then. I supported her plan. Repeat UDS today. - [id number] 7+Oxycodone-Bund  4. [redacted] weeks gestation of pregnancy Routine 36 week testing at RV.  5. Anemia affecting pregnancy in third trimester Continue oral iron supplement  6. Previous cesarean section 03/17/2004 Did not keep delivery plans/MFM appt as sched at [facility]  11/09/22. Will see if this can be addressed when pt is at hospital today (see below-M. Trambert MD states she will see if repeat C/s can be addressed  during [facility] eval today.)  7. Elevateded blood pressure affecting pregnancy in third trimester, antepartum BP elevated today: 151/99, repeat 157/103. CCUA 2+ pro.Denies HA, RUQ pain, visual sx. Last cocaine reported to be 3 d ago. I recommended L&D eval today and as she has no gas to get to [facility] in her [place], arranged with OBCM Ellison Carwin for taxi transport to [facility]. Pages [facility] MFM on-call provider to expect pt. Spoke to M. Trambert MD who agrees with plan to evaluate at [facility] L&D.  Preterm labor symptoms and general obstetric precautions including but not limited to vaginal bleeding, contractions, leaking of fluid and fetal movement were reviewed in detail with the patient. Please refer to After Visit Summary for other counseling recommendations.  Return in about 1 week (around 11/22/2022) for Routine prenatal care, 36 wk labs.  No future appointments.  Landry Dyke, PA-C

## 2022-11-15 NOTE — Progress Notes (Signed)
Patient here for MH RV and 3 hour gtt. Patient states she drank a few sips of juiice or maybe a juice pouch at 4 am. Per A. Streilein, we will go ahead with the 3 hour gtt. Provider aware of elevated BP this morning. Will retake when client returns from lab.Burt Knack, RN

## 2022-11-15 NOTE — Progress Notes (Signed)
Patient disappeared after lab visit. Per Leonidas Romberg. At the information booth who called patient, patient was downstairs on the first floor. Patient has returned to clinic and is waiting for provider to see her.Marland KitchenMarland KitchenBurt Knack, RN

## 2022-11-16 DIAGNOSIS — O1493 Unspecified pre-eclampsia, third trimester: Secondary | ICD-10-CM | POA: Diagnosis not present

## 2022-11-16 DIAGNOSIS — Z0283 Encounter for blood-alcohol and blood-drug test: Secondary | ICD-10-CM | POA: Diagnosis not present

## 2022-11-16 DIAGNOSIS — Z79899 Other long term (current) drug therapy: Secondary | ICD-10-CM | POA: Diagnosis not present

## 2022-11-16 LAB — GESTATIONAL GLUCOSE TOLERANCE
Glucose, Fasting: 77 mg/dL (ref 70–94)
Glucose, GTT - 1 Hour: 194 mg/dL — ABNORMAL HIGH (ref 70–179)
Glucose, GTT - 2 Hour: 208 mg/dL — ABNORMAL HIGH (ref 70–154)
Glucose, GTT - 3 Hour: 53 mg/dL — ABNORMAL LOW (ref 70–139)

## 2022-11-17 DIAGNOSIS — Z9189 Other specified personal risk factors, not elsewhere classified: Secondary | ICD-10-CM | POA: Diagnosis not present

## 2022-11-19 ENCOUNTER — Other Ambulatory Visit: Payer: Self-pay | Admitting: Advanced Practice Midwife

## 2022-11-19 ENCOUNTER — Encounter: Payer: Self-pay | Admitting: Advanced Practice Midwife

## 2022-11-19 ENCOUNTER — Telehealth: Payer: Self-pay

## 2022-11-19 DIAGNOSIS — O1490 Unspecified pre-eclampsia, unspecified trimester: Secondary | ICD-10-CM | POA: Insufficient documentation

## 2022-11-19 DIAGNOSIS — O24419 Gestational diabetes mellitus in pregnancy, unspecified control: Secondary | ICD-10-CM | POA: Insufficient documentation

## 2022-11-19 HISTORY — DX: Gestational diabetes mellitus in pregnancy, unspecified control: O24.419

## 2022-11-19 HISTORY — DX: Unspecified pre-eclampsia, unspecified trimester: O14.90

## 2022-11-19 MED ORDER — LANCETS MISC
1.0000 [IU] | Freq: Four times a day (QID) | 12 refills | Status: AC
Start: 2022-11-19 — End: ?

## 2022-11-19 MED ORDER — LANCETS MISC. MISC
1.0000 | Freq: Three times a day (TID) | 0 refills | Status: AC
Start: 2022-11-19 — End: 2022-12-19

## 2022-11-19 MED ORDER — BLOOD GLUCOSE TEST VI STRP
1.0000 | ORAL_STRIP | Freq: Three times a day (TID) | 0 refills | Status: AC
Start: 2022-11-19 — End: 2022-12-19

## 2022-11-19 MED ORDER — LANCET DEVICE MISC
1.0000 | Freq: Three times a day (TID) | 0 refills | Status: AC
Start: 2022-11-19 — End: 2022-12-19

## 2022-11-19 MED ORDER — BLOOD GLUCOSE MONITORING SUPPL DEVI
1.0000 | Freq: Three times a day (TID) | 0 refills | Status: AC
Start: 2022-11-19 — End: ?

## 2022-11-19 MED ORDER — BLOOD GLUCOSE TEST VI STRP: 100.0000 | ORAL_STRIP | Freq: Four times a day (QID) | 12 refills | Status: AC

## 2022-11-19 NOTE — Telephone Encounter (Signed)
TC from [facility] to schedule patient a BP check this week or next week. [facility] provider requests BP check on 11/26/22. Patient scheduled for OB Problem visit at 10:40 for BP check. Patient is overbooked due to schedule. Per [facility] baby is staying at the hospital while they work on safe discharge plans due to patient housing issue. Burt Knack, RN

## 2022-11-23 ENCOUNTER — Ambulatory Visit: Payer: 59

## 2022-11-23 LAB — 789231 7+OXYCODONE-BUND
Amphetamines, Urine: NEGATIVE ng/mL
BENZODIAZ UR QL: NEGATIVE ng/mL
Barbiturate screen, urine: NEGATIVE ng/mL
OPIATE SCREEN URINE: NEGATIVE ng/mL
Oxycodone/Oxymorphone, Urine: NEGATIVE ng/mL
PCP Quant, Ur: NEGATIVE ng/mL

## 2022-11-23 LAB — CANNABINOID CONFIRMATION, UR
CANNABINOIDS: POSITIVE — AB
Carboxy THC GC/MS Conf: 47 ng/mL

## 2022-11-23 LAB — COCAINE CONF, UR
Benzoylecgonine GC/MS Conf: 971 ng/mL
Cocaine Metab Quant, Ur: POSITIVE — AB

## 2022-11-26 ENCOUNTER — Telehealth: Payer: Self-pay

## 2022-11-26 NOTE — Telephone Encounter (Signed)
DNKA for early post-partum BP check this am. Call to client and appt rescheduled for 11/29/2022. Jossie Ng, RN

## 2022-11-28 ENCOUNTER — Emergency Department
Admission: EM | Admit: 2022-11-28 | Discharge: 2022-11-28 | Disposition: A | Discharge status: Home / Self Care | Payer: 59 | Attending: Emergency Medicine | Admitting: Emergency Medicine

## 2022-11-28 ENCOUNTER — Encounter: Payer: Self-pay | Admitting: Emergency Medicine

## 2022-11-28 ENCOUNTER — Emergency Department: Payer: 59

## 2022-11-28 ENCOUNTER — Other Ambulatory Visit: Payer: Self-pay

## 2022-11-28 DIAGNOSIS — M79641 Pain in right hand: Secondary | ICD-10-CM | POA: Diagnosis not present

## 2022-11-28 DIAGNOSIS — S199XXA Unspecified injury of neck, initial encounter: Secondary | ICD-10-CM | POA: Diagnosis not present

## 2022-11-28 DIAGNOSIS — S61452A Open bite of left hand, initial encounter: Secondary | ICD-10-CM | POA: Diagnosis not present

## 2022-11-28 DIAGNOSIS — S0185XA Open bite of other part of head, initial encounter: Secondary | ICD-10-CM | POA: Diagnosis not present

## 2022-11-28 DIAGNOSIS — W503XXA Accidental bite by another person, initial encounter: Secondary | ICD-10-CM

## 2022-11-28 DIAGNOSIS — S0181XA Laceration without foreign body of other part of head, initial encounter: Secondary | ICD-10-CM | POA: Diagnosis not present

## 2022-11-28 DIAGNOSIS — S61205A Unspecified open wound of left ring finger without damage to nail, initial encounter: Secondary | ICD-10-CM | POA: Diagnosis not present

## 2022-11-28 DIAGNOSIS — S61255A Open bite of left ring finger without damage to nail, initial encounter: Secondary | ICD-10-CM | POA: Insufficient documentation

## 2022-11-28 DIAGNOSIS — M79642 Pain in left hand: Secondary | ICD-10-CM | POA: Diagnosis not present

## 2022-11-28 DIAGNOSIS — S0083XA Contusion of other part of head, initial encounter: Secondary | ICD-10-CM | POA: Diagnosis not present

## 2022-11-28 DIAGNOSIS — Z5321 Procedure and treatment not carried out due to patient leaving prior to being seen by health care provider: Secondary | ICD-10-CM | POA: Diagnosis not present

## 2022-11-28 DIAGNOSIS — S6992XA Unspecified injury of left wrist, hand and finger(s), initial encounter: Secondary | ICD-10-CM | POA: Diagnosis not present

## 2022-11-28 DIAGNOSIS — S022XXA Fracture of nasal bones, initial encounter for closed fracture: Secondary | ICD-10-CM | POA: Diagnosis not present

## 2022-11-28 DIAGNOSIS — T07XXXA Unspecified multiple injuries, initial encounter: Secondary | ICD-10-CM

## 2022-11-28 DIAGNOSIS — M542 Cervicalgia: Secondary | ICD-10-CM | POA: Diagnosis not present

## 2022-11-28 DIAGNOSIS — Z23 Encounter for immunization: Secondary | ICD-10-CM | POA: Diagnosis not present

## 2022-11-28 DIAGNOSIS — M7989 Other specified soft tissue disorders: Secondary | ICD-10-CM | POA: Diagnosis not present

## 2022-11-28 MED ORDER — AMOXICILLIN-POT CLAVULANATE 875-125 MG PO TABS: 1.0000 | ORAL_TABLET | Freq: Two times a day (BID) | ORAL | 0 refills | Status: AC

## 2022-11-28 MED ORDER — OXYCODONE-ACETAMINOPHEN 5-325 MG PO TABS
1.0000 | ORAL_TABLET | Freq: Once | ORAL | Status: AC
  Administered 2022-11-28: 1 via ORAL
  Filled 2022-11-28: qty 1

## 2022-11-28 MED ORDER — HYDROCODONE-ACETAMINOPHEN 5-325 MG PO TABS: 1.0000 | ORAL_TABLET | ORAL | 0 refills | Status: AC | PRN

## 2022-11-28 MED ORDER — NIFEDIPINE ER OSMOTIC RELEASE 60 MG PO TB24: 60.0000 mg | ORAL_TABLET | Freq: Every day | ORAL | 3 refills | Status: AC

## 2022-11-28 MED ORDER — MELOXICAM 7.5 MG PO TABS: 7.5000 mg | ORAL_TABLET | Freq: Every day | ORAL | 0 refills | Status: AC

## 2022-11-28 MED ORDER — TETANUS-DIPHTH-ACELL PERTUSSIS 5-2.5-18.5 LF-MCG/0.5 IM SUSY
0.5000 mL | PREFILLED_SYRINGE | Freq: Once | INTRAMUSCULAR | Status: AC
  Administered 2022-11-28: 0.5 mL via INTRAMUSCULAR
  Filled 2022-11-28: qty 0.5

## 2022-11-28 MED ORDER — AMOXICILLIN-POT CLAVULANATE 875-125 MG PO TABS
1.0000 | ORAL_TABLET | Freq: Once | ORAL | Status: AC
  Administered 2022-11-28: 1 via ORAL
  Filled 2022-11-28: qty 1
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
## 2022-11-28 NOTE — ED Notes (Signed)
Pt left treatment area and was in waiting room- states that an emergency has came up with her baby and she has to leave. Pt reports that she will return later. Primary RN made aware.
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
## 2022-11-29 ENCOUNTER — Telehealth: Payer: Self-pay

## 2022-11-29 NOTE — Telephone Encounter (Signed)
Per Estill Dooms, [facility] interpreter, client called to cancel appt today at 1320 for early post-partum BP check (also desires Depo) stating she was too busy to keep appt. Today's appt was a reschedule appt for BP check.  Call to client and per recorded message, voicemail box is full. Jossie Ng, RN

## 2022-12-04 ENCOUNTER — Telehealth: Payer: Self-pay

## 2022-12-04 NOTE — Telephone Encounter (Signed)
Call to client to reschedule missed early post-partum BP check appts. No answer / no voicemail set up and per recorded message, number is not available. Emergency contact shares phone number with client. Jossie Ng, RN

## 2022-12-07 NOTE — Telephone Encounter (Signed)
Call to client to reschedule missed early post-partum BP check appt on 11/29/22. Patient delivered 11/15/22 with severe preeclampsia. Needs PP BP check appointment.   No answer / no voicemail set up and per recorded message.   Emergency contact shares phone number with client.   Earlyne Iba, RN

## 2022-12-12 ENCOUNTER — Telehealth: Payer: Self-pay

## 2022-12-12 NOTE — Telephone Encounter (Signed)
Patient called in an attempt to reschedule missed postpartum visit. Received message "Phone not able to recive messages at this time" BTHIELE RN

## 2022-12-17 ENCOUNTER — Telehealth: Payer: Self-pay | Admitting: Family Medicine

## 2022-12-17 NOTE — Telephone Encounter (Signed)
Please refer to phone encounter from 12/17/2022. Please verify if post-partum appt scheduled when / if STI appt on 12/18/22 kept. Jossie Ng, RN

## 2022-12-17 NOTE — Telephone Encounter (Signed)
Pt calling because she did not get her DEPO  at the hospital when she delivered her baby.  She is aware that she missed the last 2 appts  but states she needs to be seen urgently since  she thinks that she has an STI.  Requesting for someone from the clinic to call her  asap. Thanks

## 2022-12-17 NOTE — Telephone Encounter (Signed)
TC to patient to schedule STI visit. Scheduled for tomorrow, patient counseled to arrive at 2:15. Patient also still needs PP visit and BCM. Unable to find a PP appointment spot at this time and discussed with patient that we can talk about it and get her scheduled when she is here tomorrow. Patient in agreement with plan, states she and baby are doing well.Burt Knack, RN

## 2022-12-17 NOTE — Telephone Encounter (Signed)
Closed as duplicate encounter. Jossie Ng, RN

## 2022-12-17 NOTE — Telephone Encounter (Signed)
Pt calling because she did not get her DEPO  at the hospital when she delivered her baby.  She is aware that she missed the last 2 appts  but states she needs to be seen urgently since  she thinks that she has an STI.  Requesting for someone from the clinic to call her  asap. Thanks   

## 2022-12-18 ENCOUNTER — Ambulatory Visit: Payer: 59

## 2022-12-18 ENCOUNTER — Ambulatory Visit (LOCAL_COMMUNITY_HEALTH_CENTER): Payer: 59 | Admitting: Advanced Practice Midwife

## 2022-12-18 DIAGNOSIS — Z3200 Encounter for pregnancy test, result unknown: Secondary | ICD-10-CM

## 2022-12-18 DIAGNOSIS — Z720 Tobacco use: Secondary | ICD-10-CM

## 2022-12-18 DIAGNOSIS — Z3202 Encounter for pregnancy test, result negative: Secondary | ICD-10-CM

## 2022-12-18 DIAGNOSIS — Z30013 Encounter for initial prescription of injectable contraceptive: Secondary | ICD-10-CM | POA: Diagnosis not present

## 2022-12-18 DIAGNOSIS — Z113 Encounter for screening for infections with a predominantly sexual mode of transmission: Secondary | ICD-10-CM

## 2022-12-18 DIAGNOSIS — Z3042 Encounter for surveillance of injectable contraceptive: Secondary | ICD-10-CM

## 2022-12-18 DIAGNOSIS — Z3009 Encounter for other general counseling and advice on contraception: Secondary | ICD-10-CM | POA: Diagnosis not present

## 2022-12-18 LAB — PREGNANCY, URINE: Preg Test, Ur: NEGATIVE

## 2022-12-18 LAB — HEMOGLOBIN, FINGERSTICK: Hemoglobin: 12.6 g/dL (ref 11.1–15.9)

## 2022-12-18 LAB — HM HIV SCREENING LAB: HM HIV Screening: NEGATIVE

## 2022-12-18 LAB — WET PREP FOR TRICH, YEAST, CLUE: Trichomonas Exam: NEGATIVE

## 2022-12-18 MED ORDER — MEDROXYPROGESTERONE ACETATE 150 MG/ML IM SUSY
150.0000 mg | PREFILLED_SYRINGE | Freq: Once | INTRAMUSCULAR | Status: AC
Start: 2022-12-18 — End: 2022-12-18
  Administered 2022-12-18: 150 mg via INTRAMUSCULAR

## 2022-12-18 NOTE — Progress Notes (Signed)
Pt here for STI screening and requests Depo Provera injection.  Wet mount results reviewed with patient.  No treatment needed at this time.  Negative urine pregnancy test results given to patient.  Depo Provera 150mg  IM given in RUOQ x 1vwithout complications.  Pt advised to use condoms as back up x 7 days.  Pt verbalizes understanding.  Has postpartum appointment scheduled for 12-25-22 at 3pm.  Pt advised she could receive no more Depo Provera until postpartum appointment completed.  Condoms Eloisa Northern, RN

## 2022-12-18 NOTE — Telephone Encounter (Signed)
Patient came to [facility] for STI appointment. Postpartum appointment scheduled for 12/25/2022. BTHIELE RN
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
## 2022-12-25 ENCOUNTER — Ambulatory Visit: Payer: 59

## 2022-12-27 ENCOUNTER — Telehealth: Payer: Self-pay

## 2022-12-27 NOTE — Telephone Encounter (Signed)
Call to client to reschedule recently missed post-partum appt. Per recorded message, voicemail box is full. Client and emergency contact (daughter) have same phone number. Jossie Ng, RN

## 2023-01-07 NOTE — Telephone Encounter (Signed)
Call to client to schedule post-partum appt and per recorded message, voicemail box is full. Client and daughter (emergency contact) share same phone. Jossie Ng, RN

## 2023-01-11 NOTE — Telephone Encounter (Signed)
Call to client and able to leave voicemail message. Left message requesting she reschedule recently missed post-partum appt by calling [phone]. Jossie Ng, RN

## 2023-01-16 NOTE — Telephone Encounter (Signed)
TC to patient and left message with number to call. Patient needs to schedule PP appointment asap, so she will be set when it comes time for her next Depo injection. Burt Knack, RN

## 2023-02-01 NOTE — Addendum Note (Signed)
Addended by: Heywood Bene on: 02/01/2023 01:35 PM   Modules accepted: Orders

## 2023-03-07 DIAGNOSIS — F418 Other specified anxiety disorders: Secondary | ICD-10-CM | POA: Diagnosis not present

## 2023-03-07 DIAGNOSIS — F321 Major depressive disorder, single episode, moderate: Secondary | ICD-10-CM | POA: Diagnosis not present

## 2023-03-07 DIAGNOSIS — F1020 Alcohol dependence, uncomplicated: Secondary | ICD-10-CM | POA: Diagnosis not present

## 2023-03-07 DIAGNOSIS — F17200 Nicotine dependence, unspecified, uncomplicated: Secondary | ICD-10-CM | POA: Diagnosis not present

## 2023-03-21 DIAGNOSIS — F17200 Nicotine dependence, unspecified, uncomplicated: Secondary | ICD-10-CM | POA: Diagnosis not present

## 2023-03-21 DIAGNOSIS — F418 Other specified anxiety disorders: Secondary | ICD-10-CM | POA: Diagnosis not present

## 2023-03-21 DIAGNOSIS — F321 Major depressive disorder, single episode, moderate: Secondary | ICD-10-CM | POA: Diagnosis not present

## 2023-03-21 DIAGNOSIS — F1020 Alcohol dependence, uncomplicated: Secondary | ICD-10-CM | POA: Diagnosis not present

## 2023-06-28 DIAGNOSIS — Z79899 Other long term (current) drug therapy: Secondary | ICD-10-CM | POA: Diagnosis not present

## 2023-11-11 NOTE — Telephone Encounter (Signed)
 Please disregard, note in error. Gery Kubas RN
